# Patient Record
Sex: Male | Born: 1935
Health system: Southern US, Community
[De-identification: ages and names within clinical notes are randomized; demographics above are authoritative.]

## PROBLEM LIST (undated history)

## (undated) DIAGNOSIS — Z8673 Personal history of transient ischemic attack (TIA), and cerebral infarction without residual deficits: Secondary | ICD-10-CM

## (undated) DIAGNOSIS — K219 Gastro-esophageal reflux disease without esophagitis: Secondary | ICD-10-CM

## (undated) DIAGNOSIS — R413 Other amnesia: Secondary | ICD-10-CM

## (undated) DIAGNOSIS — G3184 Mild cognitive impairment, so stated: Secondary | ICD-10-CM

## (undated) DIAGNOSIS — G4730 Sleep apnea, unspecified: Secondary | ICD-10-CM

## (undated) DIAGNOSIS — E785 Hyperlipidemia, unspecified: Secondary | ICD-10-CM

## (undated) DIAGNOSIS — H269 Unspecified cataract: Secondary | ICD-10-CM

## (undated) DIAGNOSIS — N400 Enlarged prostate without lower urinary tract symptoms: Secondary | ICD-10-CM

## (undated) DIAGNOSIS — I10 Essential (primary) hypertension: Secondary | ICD-10-CM

## (undated) DIAGNOSIS — R630 Anorexia: Secondary | ICD-10-CM

## (undated) DIAGNOSIS — M1990 Unspecified osteoarthritis, unspecified site: Secondary | ICD-10-CM

## (undated) DIAGNOSIS — H409 Unspecified glaucoma: Secondary | ICD-10-CM

## (undated) DIAGNOSIS — K5790 Diverticulosis of intestine, part unspecified, without perforation or abscess without bleeding: Secondary | ICD-10-CM

## (undated) HISTORY — DX: Anorexia: R63.0

## (undated) HISTORY — DX: Unspecified glaucoma: H40.9

## (undated) HISTORY — DX: Mild cognitive impairment of uncertain or unknown etiology: G31.84

## (undated) HISTORY — DX: Benign prostatic hyperplasia without lower urinary tract symptoms: N40.0

## (undated) HISTORY — DX: Unspecified cataract: H26.9

## (undated) HISTORY — DX: Diverticulosis of intestine, part unspecified, without perforation or abscess without bleeding: K57.90

## (undated) HISTORY — DX: Other amnesia: R41.3

---

## 1995-10-28 HISTORY — PX: TRANSURETHRAL RESECTION OF PROSTATE: SHX73

## 1999-11-22 ENCOUNTER — Encounter: Payer: Self-pay | Admitting: Pulmonary Disease

## 1999-11-22 ENCOUNTER — Ambulatory Visit (HOSPITAL_COMMUNITY): Admission: RE | Admit: 1999-11-22 | Discharge: 1999-11-22 | Payer: Self-pay | Admitting: Pulmonary Disease

## 1999-11-25 ENCOUNTER — Encounter: Admission: RE | Admit: 1999-11-25 | Discharge: 1999-12-23 | Payer: Self-pay | Admitting: Pulmonary Disease

## 2002-10-04 ENCOUNTER — Ambulatory Visit (HOSPITAL_COMMUNITY): Admission: RE | Admit: 2002-10-04 | Discharge: 2002-10-04 | Payer: Self-pay | Admitting: Gastroenterology

## 2003-12-13 ENCOUNTER — Ambulatory Visit (HOSPITAL_BASED_OUTPATIENT_CLINIC_OR_DEPARTMENT_OTHER): Admission: RE | Admit: 2003-12-13 | Discharge: 2003-12-13 | Payer: Self-pay | Admitting: Pulmonary Disease

## 2004-02-05 ENCOUNTER — Emergency Department (HOSPITAL_COMMUNITY): Admission: EM | Admit: 2004-02-05 | Discharge: 2004-02-05 | Payer: Self-pay | Admitting: Family Medicine

## 2004-02-15 ENCOUNTER — Emergency Department (HOSPITAL_COMMUNITY): Admission: EM | Admit: 2004-02-15 | Discharge: 2004-02-15 | Payer: Self-pay | Admitting: Family Medicine

## 2004-10-15 ENCOUNTER — Ambulatory Visit (HOSPITAL_COMMUNITY): Admission: RE | Admit: 2004-10-15 | Discharge: 2004-10-15 | Payer: Self-pay | Admitting: Pulmonary Disease

## 2004-10-15 ENCOUNTER — Encounter (INDEPENDENT_AMBULATORY_CARE_PROVIDER_SITE_OTHER): Payer: Self-pay | Admitting: Cardiology

## 2005-09-29 ENCOUNTER — Ambulatory Visit (HOSPITAL_BASED_OUTPATIENT_CLINIC_OR_DEPARTMENT_OTHER): Admission: RE | Admit: 2005-09-29 | Discharge: 2005-09-29 | Payer: Self-pay | Admitting: General Surgery

## 2005-09-29 ENCOUNTER — Ambulatory Visit (HOSPITAL_COMMUNITY): Admission: RE | Admit: 2005-09-29 | Discharge: 2005-09-29 | Payer: Self-pay | Admitting: General Surgery

## 2005-09-30 ENCOUNTER — Ambulatory Visit: Payer: Self-pay | Admitting: Oncology

## 2005-11-18 ENCOUNTER — Ambulatory Visit: Payer: Self-pay | Admitting: Oncology

## 2005-11-28 ENCOUNTER — Ambulatory Visit (HOSPITAL_BASED_OUTPATIENT_CLINIC_OR_DEPARTMENT_OTHER): Admission: RE | Admit: 2005-11-28 | Discharge: 2005-11-28 | Payer: Self-pay | Admitting: General Surgery

## 2006-08-20 ENCOUNTER — Ambulatory Visit (HOSPITAL_COMMUNITY): Admission: RE | Admit: 2006-08-20 | Discharge: 2006-08-20 | Payer: Self-pay | Admitting: Pulmonary Disease

## 2006-08-20 ENCOUNTER — Encounter: Payer: Self-pay | Admitting: Vascular Surgery

## 2009-03-11 ENCOUNTER — Emergency Department (HOSPITAL_COMMUNITY): Admission: EM | Admit: 2009-03-11 | Discharge: 2009-03-11 | Payer: Self-pay | Admitting: Family Medicine

## 2009-04-13 ENCOUNTER — Ambulatory Visit (HOSPITAL_COMMUNITY): Admission: RE | Admit: 2009-04-13 | Discharge: 2009-04-13 | Payer: Self-pay | Admitting: Pulmonary Disease

## 2010-01-17 ENCOUNTER — Emergency Department (HOSPITAL_COMMUNITY): Admission: EM | Admit: 2010-01-17 | Discharge: 2010-01-17 | Payer: Self-pay | Admitting: Emergency Medicine

## 2011-03-14 NOTE — Op Note (Signed)
NAME:  Jason Castaneda, Jason Castaneda               ACCOUNT NO.:  [id number]   MEDICAL RECORD NO.:  [id number]          PATIENT TYPE:  AMB   LOCATION:  [facility]                         FACILITY:  [facility]   PHYSICIAN:  Leonie Man, M.D.   DATE OF BIRTH:  1931-05-20   DATE OF PROCEDURE:  11/28/2005  DATE OF DISCHARGE:                                 OPERATIVE REPORT   PREOPERATIVE DIAGNOSIS:  Left inguinal hernia   POSTOPERATIVE DIAGNOSIS:  Left inguinal hernia.   PROCEDURE:  Left inguinal herniorrhaphy with mesh using Prolene hernia mesh  system.   SURGEON:  Leonie Man, M.D.   ASSISTANT:  OR nurse.   ANESTHESIA:  General.   PATHOLOGY:  No specimens to the lab.   ESTIMATED BLOOD LOSS:  Minimal.   COMPLICATIONS:  No complications. The patient went to the PACU in excellent  condition.   Jason Castaneda is a 77 year old gentleman with a left-sided inguinal hernia. His  surgery had been previously postponed due to an elevated partial  thromboplastin time. He has down through a complete workup of his clotting  factors with no abnormalities being seen. Having gotten clearance through  the hematologist, the patient comes to the operating room now for left  inguinal herniorrhaphy. The risks and potential benefits of surgery have  been fully discussed, all questions answered and consent obtained.   DESCRIPTION OF PROCEDURE:  Following the induction of satisfactory general  anesthesia, the patient was positioned supinely and the left groin is  prepped and draped to be included in a sterile operative field. The lower  groin crease is infiltrated with 0.5% Marcaine with epinephrine and a  transverse incision is made into the lower groin crease deep and through the  skin and subcutaneous tissue with dissection carried down to the external  oblique aponeurosis. The external oblique aponeurosis was opened up through  the external inguinal ring with retraction of the ilioinguinal nerve  superiorly and  medially. The spermatic cord is elevated and held with a  Penrose drain. There was no indirect hernia noted. There was a large direct  hernia noted and this was reduced back into the retroperitoneum. The  retroperitoneum was then dissected clear and a Prolene hernia patch mesh  system was inserted into the retroperitoneum and deployed superiorly,  medially, laterally and inferiorly to cover the entire direct space. The the  external portion of the patch was then brought out and deployed into the  inguinal canal. The mesh was then split so as to allow the formation of a  new internal ring and this was packed down to the shelving edge of Poupart's  ligament. The mesh was then trimmed so as to fit into the inguinal canal and  a running suture of 2-0 Novofil was used running from the pubic tubercle up  along the conjoined tendon to the internal ring and again from the pubic  tubercle up along the shelving edge of Poupart's ligament to the internal  ring. Behind the cord, the mesh was deployed over the internal oblique  muscle. All areas of dissection were then meticulously checked for  hemostasis. All  areas of dissection were noted to be dry. Sponge, instrument  and sharp counts were verified. The external oblique aponeurosis was then  closed over the cord with a running 2-0 Vicryl suture. Scarpa's fascia was  closed with a running 3-0 Vicryl suture and the skin was reapproximated with  a running 4-0 Monocryl suture and then reinforced with Steri-Strips. Sterile  dressings were applied. The anesthetic reversed and the patient removed from  the operating room to the recovery room in stable condition. He tolerated  the procedure well.      Leonie Man, M.D.  Electronically Signed     PB/MEDQ  D:  11/28/2005  T:  11/28/2005  Job:  [id number]

## 2011-04-12 ENCOUNTER — Inpatient Hospital Stay (INDEPENDENT_AMBULATORY_CARE_PROVIDER_SITE_OTHER)
Admission: RE | Admit: 2011-04-12 | Discharge: 2011-04-12 | Disposition: A | Discharge status: Home / Self Care | Payer: Medicare Other | Source: Ambulatory Visit | Attending: Family Medicine | Admitting: Family Medicine

## 2011-04-12 DIAGNOSIS — R197 Diarrhea, unspecified: Secondary | ICD-10-CM

## 2011-04-12 DIAGNOSIS — R109 Unspecified abdominal pain: Secondary | ICD-10-CM

## 2011-04-12 LAB — BASIC METABOLIC PANEL
CO2: 25 mEq/L (ref 19–32)
Chloride: 103 mEq/L (ref 96–112)
Creatinine, Ser: 0.72 mg/dL (ref 0.50–1.35)
GFR calc Af Amer: >60 mL/min (ref >60)
Potassium: 3.9 mEq/L (ref 3.5–5.1)
Sodium: 136 mEq/L (ref 135–145)

## 2011-04-12 LAB — CBC
Platelets: 185 10*3/uL (ref 150–400)
RBC: 4.41 MIL/uL (ref 4.22–5.81)
RDW: 12.4 % (ref 11.5–15.5)
WBC: 4 10*3/uL (ref 4.0–10.5)

## 2011-04-12 LAB — DIFFERENTIAL
Basophils Absolute: 0 10*3/uL (ref 0.0–0.1)
Eosinophils Absolute: 0.1 10*3/uL (ref 0.0–0.7)
Eosinophils Relative: 2 % (ref 0–5)
Neutrophils Relative %: 45 % (ref 43–77)

## 2012-07-20 ENCOUNTER — Emergency Department (INDEPENDENT_AMBULATORY_CARE_PROVIDER_SITE_OTHER)
Admission: EM | Admit: 2012-07-20 | Discharge: 2012-07-20 | Disposition: A | Discharge status: Home / Self Care | Payer: Medicare Other | Source: Home / Self Care | Attending: Family Medicine | Admitting: Family Medicine

## 2012-07-20 ENCOUNTER — Encounter (HOSPITAL_COMMUNITY): Payer: Self-pay | Admitting: *Deleted

## 2012-07-20 DIAGNOSIS — M2550 Pain in unspecified joint: Secondary | ICD-10-CM

## 2012-07-20 HISTORY — DX: Essential (primary) hypertension: I10

## 2012-07-20 HISTORY — DX: Gastro-esophageal reflux disease without esophagitis: K21.9

## 2012-07-20 HISTORY — DX: Hyperlipidemia, unspecified: E78.5

## 2012-07-20 MED ORDER — MELOXICAM 7.5 MG PO TABS: 7.5000 mg | ORAL_TABLET | Freq: Every day | ORAL | Status: DC

## 2012-07-20 MED ORDER — ACETAMINOPHEN ER 650 MG PO TBCR: 650.0000 mg | EXTENDED_RELEASE_TABLET | Freq: Three times a day (TID) | ORAL | Status: DC | PRN

## 2012-07-20 NOTE — ED Provider Notes (Signed)
History     CSN: [id number]  Arrival date & time 07/20/12  1510   First MD Initiated Contact with Patient 07/20/12 1615      Chief Complaint  Patient presents with  . Joint Pain  . Toe Pain    (Consider location/radiation/quality/duration/timing/severity/associated sxs/prior treatment) HPI Jason Castaneda is a 78 y.o. male who complains of left shoulder and left side low back pain described as aching, soreness and bothersome mostly following exercise for the past 1 month. Pt is exercising three times a week at [facility]. Pt reports stiffness and joint in the morning. No OTC taken for discomfort. Denies any numbness, tingling to LE.  Complains of L great toe pain x 1 month as well. Denies any injury.  No red flags such as fevers, h/o trauma with bony tenderness, neurological deficits, h/o CA, unexplained weight loss, pain worse at night, pain at rest,  h/o prolonged steroid use or h/o osteopenia.      Past Medical History  Diagnosis Date  . GERD (gastroesophageal reflux disease)   . Hypertension   . Hyperlipemia     History reviewed. No pertinent past surgical history.  Family History  Problem Relation Age of Onset  . Family history unknown: Yes    History  Substance Use Topics  . Smoking status: Never Smoker   . Smokeless tobacco: Not on file  . Alcohol Use: No      Review of Systems  Constitutional: Negative.   Genitourinary: Negative.   Musculoskeletal: Positive for myalgias, back pain and arthralgias. Negative for joint swelling and gait problem.  Skin: Negative.   Neurological: Negative.     Allergies  Review of patient's allergies indicates no known allergies.  Home Medications   Current Outpatient Rx  Name Route Sig Dispense Refill  . ATORVASTATIN CALCIUM 20 MG PO TABS Oral Take 20 mg by mouth daily.    Marland Kitchen OLMESARTAN MEDOXOMIL 20 MG PO TABS Oral Take 20 mg by mouth daily.    Marland Kitchen OMEPRAZOLE 40 MG PO CPDR Oral Take 40 mg by mouth daily.    . ACETAMINOPHEN  ER 650 MG PO TBCR Oral Take 1 tablet (650 mg total) by mouth every 8 (eight) hours as needed for pain. 30 tablet 2  . MELOXICAM 7.5 MG PO TABS Oral Take 1 tablet (7.5 mg total) by mouth daily. 30 tablet 2    BP 146/97  Pulse 62  Temp 98.4 F (36.9 C) (Oral)  Resp 16  SpO2 100%  Physical Exam  Nursing note and vitals reviewed. Constitutional: He is oriented to person, place, and time. Vital signs are normal. He appears well-developed and well-nourished. He is active and cooperative.  HENT:  Head: Normocephalic.  Eyes: Conjunctivae normal are normal. Pupils are equal, round, and reactive to light. No scleral icterus.  Neck: Trachea normal. Neck supple.  Cardiovascular: Normal rate, regular rhythm, normal heart sounds, intact distal pulses and normal pulses.   Pulmonary/Chest: Effort normal and breath sounds normal.  Musculoskeletal: Normal range of motion.       Left shoulder: Normal.       Thoracic back: Normal.       Lumbar back: He exhibits tenderness. He exhibits normal range of motion, no bony tenderness, no swelling, no edema, no deformity, no laceration and no spasm.       Back:       Left upper leg: Normal.       Left lower leg: Normal.       Left foot:  Normal.       Tenderness at palpation to left paraspinal  Neurological: He is alert and oriented to person, place, and time. He has normal strength and normal reflexes. No cranial nerve deficit or sensory deficit. Coordination and gait normal. GCS eye subscore is 4. GCS verbal subscore is 5. GCS motor subscore is 6.  Skin: Skin is warm and dry.  Psychiatric: He has a normal mood and affect. His speech is normal and behavior is normal. Judgment and thought content normal. Cognition and memory are normal.    ED Course  Procedures (including critical care time)  Labs Reviewed - No data to display No results found.   1. Multiple joint pain       MDM  low back pain, has been < 6 week duration. Rest, intermittent  application of cold packs (later, may switch to heat, but do not sleep on heating pad), Tylenol arthritis and Mobic as recommended. Follow up with Dr. Petra Kuba if worsening of Sx. Proper lifting with avoidance of heavy lifting discussed.       Johnsie Kindred, NP 07/20/12 1640

## 2012-07-20 NOTE — ED Notes (Signed)
Pt reports soreness in left shoulder joint, left flank and left great toe - with no known injury. Reports that he has seen pcp for the above with no dx. Denies any injury, problem urinating or decreased rom.

## 2012-07-23 NOTE — ED Provider Notes (Signed)
Medical screening examination/treatment/procedure(s) were performed by resident physician or non-physician practitioner and as supervising physician I was immediately available for consultation/collaboration.   Barkley Bruns MD.    Linna Hoff, MD 07/23/12 [phone]

## 2013-02-24 HISTORY — PX: CATARACT EXTRACTION: SUR2

## 2013-07-07 ENCOUNTER — Emergency Department (HOSPITAL_COMMUNITY)
Admission: EM | Admit: 2013-07-07 | Discharge: 2013-07-07 | Disposition: A | Discharge status: Home / Self Care | Payer: Medicare Other | Source: Home / Self Care | Attending: Family Medicine | Admitting: Family Medicine

## 2013-07-07 ENCOUNTER — Emergency Department (INDEPENDENT_AMBULATORY_CARE_PROVIDER_SITE_OTHER): Payer: Medicare Other

## 2013-07-07 ENCOUNTER — Encounter (HOSPITAL_COMMUNITY): Payer: Self-pay | Admitting: Emergency Medicine

## 2013-07-07 DIAGNOSIS — M5430 Sciatica, unspecified side: Secondary | ICD-10-CM

## 2013-07-07 DIAGNOSIS — M5431 Sciatica, right side: Secondary | ICD-10-CM

## 2013-07-07 MED ORDER — KETOROLAC TROMETHAMINE 30 MG/ML IJ SOLN
30.0000 mg | Freq: Once | INTRAMUSCULAR | Status: AC
  Administered 2013-07-07: 30 mg via INTRAMUSCULAR

## 2013-07-07 MED ORDER — METHYLPREDNISOLONE 4 MG PO KIT: PACK | ORAL | Status: DC

## 2013-07-07 MED ORDER — KETOROLAC TROMETHAMINE 30 MG/ML IJ SOLN
INTRAMUSCULAR | Status: AC
  Filled 2013-07-07: qty 1

## 2013-07-07 NOTE — ED Notes (Signed)
Pt c/o joint pain onset 10 days Sxs include: bilateral shoulder pain and pain on right side hip Pain increases w/activity... He is alert w/no signs of acute distress.

## 2013-07-07 NOTE — ED Provider Notes (Addendum)
CSN: [id number]     Arrival date & time 07/07/13  1519 History   First MD Initiated Contact with Patient 07/07/13 1551     Chief Complaint  Patient presents with  . Joint Pain   (Consider location/radiation/quality/duration/timing/severity/associated sxs/prior Treatment) Patient is a 79 y.o. male presenting with back pain. The history is provided by the patient.  Back Pain Location:  Sacro-iliac joint and gluteal region Quality:  Shooting Radiates to:  R posterior upper leg Pain severity:  Mild Onset quality:  Gradual Duration:  10 days Progression:  Unchanged Chronicity:  New Context comment:  Gradual onset, NKI. Relieved by:  None tried Worsened by:  Bending and movement Ineffective treatments:  None tried Associated symptoms: paresthesias   Associated symptoms: no abdominal pain, no bladder incontinence, no bowel incontinence, no chest pain, no dysuria, no fever, no numbness and no weakness   Risk factors comment:  Oa of shoulders, hips.   Past Medical History  Diagnosis Date  . GERD (gastroesophageal reflux disease)   . Hypertension   . Hyperlipemia    History reviewed. No pertinent past surgical history. No family history on file. History  Substance Use Topics  . Smoking status: Never Smoker   . Smokeless tobacco: Not on file  . Alcohol Use: No    Review of Systems  Constitutional: Negative for fever.  Cardiovascular: Negative for chest pain.  Gastrointestinal: Negative for nausea, vomiting, abdominal pain and bowel incontinence.  Genitourinary: Negative for bladder incontinence, dysuria, scrotal swelling and testicular pain.  Musculoskeletal: Positive for back pain. Negative for joint swelling and gait problem.  Skin: Negative.   Neurological: Positive for paresthesias. Negative for weakness and numbness.    Allergies  Review of patient's allergies indicates no known allergies.  Home Medications   Current Outpatient Rx  Name  Route  Sig  Dispense   Refill  . acetaminophen (TYLENOL ARTHRITIS PAIN) 650 MG CR tablet   Oral   Take 1 tablet (650 mg total) by mouth every 8 (eight) hours as needed for pain.   30 tablet   2   . atorvastatin (LIPITOR) 20 MG tablet   Oral   Take 20 mg by mouth daily.         . meloxicam (MOBIC) 7.5 MG tablet   Oral   Take 1 tablet (7.5 mg total) by mouth daily.   30 tablet   2   . methylPREDNISolone (MEDROL DOSEPAK) 4 MG tablet      follow package directions, start on fri, continue until finished.   21 tablet   0   . olmesartan (BENICAR) 20 MG tablet   Oral   Take 20 mg by mouth daily.         Marland Kitchen omeprazole (PRILOSEC) 40 MG capsule   Oral   Take 40 mg by mouth daily.          BP 123/56  Pulse 77  Temp(Src) 97.8 F (36.6 C) (Oral)  Resp 16  SpO2 99% Physical Exam  Nursing note and vitals reviewed. Constitutional: He is oriented to person, place, and time.  Musculoskeletal: He exhibits tenderness.       Lumbar back: He exhibits tenderness, pain and spasm. He exhibits normal range of motion, no edema, no deformity and normal pulse.       Back:  Neurological: He is alert and oriented to person, place, and time.  Skin: Skin is warm and dry.    ED Course  Procedures (including critical care time) Labs Review  Labs Reviewed - No data to display Imaging Review Dg Lumbar Spine Complete  07/07/2013   CLINICAL DATA:  Low back pain. No known injury.  EXAM: LUMBAR SPINE - COMPLETE 4+ VIEW  COMPARISON:  None.  FINDINGS: Mild degenerative spurring in the mid and lower lumbar spine. Disc spaces are maintained. Normal alignment. No fracture. SI joints are symmetric and unremarkable.  IMPRESSION: Mild degenerative spurring. No acute findings.   Electronically Signed   By: Charlett Nose M.D.   On: 07/07/2013 16:33    MDM  X-rays reviewed and report per radiologist.    Linna Hoff, MD 07/07/13 1643  Linna Hoff, MD 07/07/13 1644  Linna Hoff, MD 07/07/13 2057

## 2015-03-23 ENCOUNTER — Inpatient Hospital Stay (HOSPITAL_COMMUNITY)
Admission: EM | Admit: 2015-03-23 | Discharge: 2015-03-25 | DRG: 378 | Disposition: A | Discharge status: Home / Self Care | Payer: Medicare Other | Attending: Internal Medicine | Admitting: Internal Medicine

## 2015-03-23 ENCOUNTER — Other Ambulatory Visit: Payer: Self-pay | Admitting: Pulmonary Disease

## 2015-03-23 ENCOUNTER — Encounter (HOSPITAL_COMMUNITY): Payer: Self-pay | Admitting: *Deleted

## 2015-03-23 ENCOUNTER — Ambulatory Visit
Admission: RE | Admit: 2015-03-23 | Discharge: 2015-03-23 | Disposition: A | Discharge status: Home / Self Care | Payer: Medicare Other | Source: Ambulatory Visit | Attending: Pulmonary Disease | Admitting: Pulmonary Disease

## 2015-03-23 DIAGNOSIS — K219 Gastro-esophageal reflux disease without esophagitis: Secondary | ICD-10-CM

## 2015-03-23 DIAGNOSIS — K5791 Diverticulosis of intestine, part unspecified, without perforation or abscess with bleeding: Secondary | ICD-10-CM | POA: Diagnosis not present

## 2015-03-23 DIAGNOSIS — E785 Hyperlipidemia, unspecified: Secondary | ICD-10-CM | POA: Diagnosis present

## 2015-03-23 DIAGNOSIS — K5731 Diverticulosis of large intestine without perforation or abscess with bleeding: Principal | ICD-10-CM

## 2015-03-23 DIAGNOSIS — Z7982 Long term (current) use of aspirin: Secondary | ICD-10-CM | POA: Diagnosis not present

## 2015-03-23 DIAGNOSIS — I10 Essential (primary) hypertension: Secondary | ICD-10-CM | POA: Diagnosis present

## 2015-03-23 DIAGNOSIS — K922 Gastrointestinal hemorrhage, unspecified: Secondary | ICD-10-CM | POA: Diagnosis present

## 2015-03-23 DIAGNOSIS — M79604 Pain in right leg: Secondary | ICD-10-CM

## 2015-03-23 DIAGNOSIS — K5790 Diverticulosis of intestine, part unspecified, without perforation or abscess without bleeding: Secondary | ICD-10-CM | POA: Diagnosis present

## 2015-03-23 DIAGNOSIS — D62 Acute posthemorrhagic anemia: Secondary | ICD-10-CM | POA: Diagnosis present

## 2015-03-23 DIAGNOSIS — K625 Hemorrhage of anus and rectum: Secondary | ICD-10-CM | POA: Diagnosis present

## 2015-03-23 DIAGNOSIS — M545 Low back pain: Secondary | ICD-10-CM

## 2015-03-23 HISTORY — DX: Sleep apnea, unspecified: G47.30

## 2015-03-23 HISTORY — DX: Unspecified osteoarthritis, unspecified site: M19.90

## 2015-03-23 LAB — COMPREHENSIVE METABOLIC PANEL
ALBUMIN: 3.5 g/dL (ref 3.5–5.0)
ALT: 14 U/L — ABNORMAL LOW (ref 17–63)
ANION GAP: 7 (ref 5–15)
AST: 17 U/L (ref 15–41)
Alkaline Phosphatase: 57 U/L (ref 38–126)
BILIRUBIN TOTAL: 0.6 mg/dL (ref 0.3–1.2)
BUN: 15 mg/dL (ref 6–20)
CALCIUM: 10 mg/dL (ref 8.9–10.3)
CO2: 28 mmol/L (ref 22–32)
Chloride: 107 mmol/L (ref 101–111)
Creatinine, Ser: 0.99 mg/dL (ref 0.61–1.24)
GLUCOSE: 102 mg/dL — AB (ref 65–99)
POTASSIUM: 4.4 mmol/L (ref 3.5–5.1)
SODIUM: 142 mmol/L (ref 135–145)
TOTAL PROTEIN: 6.1 g/dL — AB (ref 6.5–8.1)

## 2015-03-23 LAB — ABO/RH: ABO/RH(D): O POS

## 2015-03-23 LAB — CBC
HCT: 34.1 % — ABNORMAL LOW (ref 39.0–52.0)
HEMOGLOBIN: 11.9 g/dL — AB (ref 13.0–17.0)
MCH: 33.3 pg (ref 26.0–34.0)
MCHC: 34.9 g/dL (ref 30.0–36.0)
MCV: 95.5 fL (ref 78.0–100.0)
Platelets: 211 10*3/uL (ref 150–400)
RBC: 3.57 MIL/uL — AB (ref 4.22–5.81)
RDW: 13.3 % (ref 11.5–15.5)
WBC: 5.1 10*3/uL (ref 4.0–10.5)

## 2015-03-23 LAB — POC OCCULT BLOOD, ED: Fecal Occult Bld: POSITIVE — AB

## 2015-03-23 LAB — TYPE AND SCREEN
ABO/RH(D): O POS
Antibody Screen: NEGATIVE

## 2015-03-23 LAB — TSH: TSH: 1.296 u[IU]/mL (ref 0.350–4.500)

## 2015-03-23 LAB — PROTIME-INR
INR: 1.13 (ref 0.00–1.49)
Prothrombin Time: 14.7 seconds (ref 11.6–15.2)

## 2015-03-23 MED ORDER — MORPHINE SULFATE 2 MG/ML IJ SOLN: 1.0000 mg | INTRAMUSCULAR | Status: DC | PRN

## 2015-03-23 MED ORDER — HYDROCODONE-ACETAMINOPHEN 5-325 MG PO TABS: 1.0000 | ORAL_TABLET | ORAL | Status: DC | PRN

## 2015-03-23 MED ORDER — SODIUM CHLORIDE 0.9 % IV SOLN: INTRAVENOUS | Status: DC

## 2015-03-23 MED ORDER — ONDANSETRON HCL 4 MG/2ML IJ SOLN: 4.0000 mg | Freq: Four times a day (QID) | INTRAMUSCULAR | Status: DC | PRN

## 2015-03-23 MED ORDER — ACETAMINOPHEN 325 MG PO TABS: 650.0000 mg | ORAL_TABLET | Freq: Four times a day (QID) | ORAL | Status: DC | PRN

## 2015-03-23 MED ORDER — PANTOPRAZOLE SODIUM 40 MG IV SOLR
40.0000 mg | Freq: Every day | INTRAVENOUS | Status: DC
  Administered 2015-03-23 – 2015-03-24 (×2): 40 mg via INTRAVENOUS
  Filled 2015-03-23 (×3): qty 40

## 2015-03-23 MED ORDER — SODIUM CHLORIDE 0.9 % IV SOLN
INTRAVENOUS | Status: DC
  Administered 2015-03-23 – 2015-03-24 (×3): via INTRAVENOUS

## 2015-03-23 MED ORDER — ONDANSETRON HCL 4 MG PO TABS: 4.0000 mg | ORAL_TABLET | Freq: Four times a day (QID) | ORAL | Status: DC | PRN

## 2015-03-23 MED ORDER — ACETAMINOPHEN 650 MG RE SUPP: 650.0000 mg | Freq: Four times a day (QID) | RECTAL | Status: DC | PRN

## 2015-03-23 NOTE — ED Provider Notes (Signed)
CSN: [id number]     Arrival date & time 03/23/15  1446 History   First MD Initiated Contact with Patient 03/23/15 1634     Chief Complaint  Patient presents with  . Rectal Bleeding     (Consider location/radiation/quality/duration/timing/severity/associated sxs/prior Treatment) Patient is a 81 y.o. male presenting with hematochezia. The history is provided by the patient.  Rectal Bleeding Associated symptoms: no abdominal pain, no fever and no vomiting   Patient w hx diverticula, c/o 4-5 episodes dark/maroon blood per rectum in the past day. Moderate amounts. Several episodes. Started yesterday. No rectal pain. No trauma. No hx gi bleeding. Hx diverticula on colonoscopy approximately 5 yrs ago. No abd pain. No nv. Normal appetite. No faintness or dizziness. No recent constipation or straining. No hx hemorrhoids. No anticoag use. No other abn bleeding or bruising.       Past Medical History  Diagnosis Date  . GERD (gastroesophageal reflux disease)   . Hypertension   . Hyperlipemia    History reviewed. No pertinent past surgical history. No family history on file. History  Substance Use Topics  . Smoking status: Never Smoker   . Smokeless tobacco: Not on file  . Alcohol Use: No    Review of Systems  Constitutional: Negative for fever and chills.  HENT: Negative for sore throat.   Eyes: Negative for redness.  Respiratory: Negative for cough and shortness of breath.   Cardiovascular: Negative for chest pain.  Gastrointestinal: Positive for hematochezia and anal bleeding. Negative for vomiting, abdominal pain and diarrhea.  Genitourinary: Negative for dysuria and flank pain.  Musculoskeletal: Negative for back pain and neck pain.  Skin: Negative for rash.  Neurological: Negative for syncope.  Hematological: Does not bruise/bleed easily.  Psychiatric/Behavioral: Negative for confusion.      Allergies  Review of patient's allergies indicates no known allergies.  Home  Medications   Prior to Admission medications   Medication Sig Start Date End Date Taking? Authorizing Provider  acetaminophen (TYLENOL ARTHRITIS PAIN) 650 MG CR tablet Take 1 tablet (650 mg total) by mouth every 8 (eight) hours as needed for pain. 07/20/12   Johnsie Kindred, NP  atorvastatin (LIPITOR) 20 MG tablet Take 20 mg by mouth daily.    Historical Provider, MD  meloxicam (MOBIC) 7.5 MG tablet Take 1 tablet (7.5 mg total) by mouth daily. 07/20/12   Johnsie Kindred, NP  methylPREDNISolone (MEDROL DOSEPAK) 4 MG tablet follow package directions, start on fri, continue until finished. 07/07/13   Linna Hoff, MD  olmesartan (BENICAR) 20 MG tablet Take 20 mg by mouth daily.    Historical Provider, MD  omeprazole (PRILOSEC) 40 MG capsule Take 40 mg by mouth daily.    Historical Provider, MD   BP 170/90 mmHg  Pulse 61  Temp(Src) 98.5 F (36.9 C) (Oral)  Resp 17  Ht  (1.676 m)  Wt 157 lb (71.215 kg)  BMI 25.35 kg/m2  SpO2 100% Physical Exam  Constitutional: He is oriented to person, place, and time. He appears well-developed and well-nourished. No distress.  HENT:  Mouth/Throat: Oropharynx is clear and moist.  Eyes: Conjunctivae are normal. No scleral icterus.  Neck: Neck supple. No tracheal deviation present.  Cardiovascular: Normal rate, regular rhythm, normal heart sounds and intact distal pulses.   Pulmonary/Chest: Effort normal and breath sounds normal. No accessory muscle usage. No respiratory distress.  Abdominal: Soft. Bowel sounds are normal. He exhibits no distension and no mass. There is no tenderness. There is no  rebound and no guarding.  Genitourinary:  Dark blood on exam. No fissure. No mass felt. No ext hem.   Musculoskeletal: Normal range of motion. He exhibits no edema or tenderness.  Neurological: He is alert and oriented to person, place, and time.  Skin: Skin is warm and dry. No rash noted. He is not diaphoretic.  Psychiatric: He has a normal mood and affect.   Nursing note and vitals reviewed.   ED Course  Procedures (including critical care time) Labs Review   Results for orders placed or performed during the hospital encounter of 03/23/15  CBC  Result Value Ref Range   WBC 5.1 4.0 - 10.5 K/uL   RBC 3.57 (L) 4.22 - 5.81 MIL/uL   Hemoglobin 11.9 (L) 13.0 - 17.0 g/dL   HCT 40.934.1 (L) 81.139.0 - 91.452.0 %   MCV 95.5 78.0 - 100.0 fL   MCH 33.3 26.0 - 34.0 pg   MCHC 34.9 30.0 - 36.0 g/dL   RDW 78.213.3 95.611.5 - 21.315.5 %   Platelets 211 150 - 400 K/uL  Comprehensive metabolic panel  Result Value Ref Range   Sodium 142 135 - 145 mmol/L   Potassium 4.4 3.5 - 5.1 mmol/L   Chloride 107 101 - 111 mmol/L   CO2 28 22 - 32 mmol/L   Glucose, Bld 102 (H) 65 - 99 mg/dL   BUN 15 6 - 20 mg/dL   Creatinine, Ser 0.860.99 0.61 - 1.24 mg/dL   Calcium 57.810.0 8.9 - 46.910.3 mg/dL   Total Protein 6.1 (L) 6.5 - 8.1 g/dL   Albumin 3.5 3.5 - 5.0 g/dL   AST 17 15 - 41 U/L   ALT 14 (L) 17 - 63 U/L   Alkaline Phosphatase 57 38 - 126 U/L   Total Bilirubin 0.6 0.3 - 1.2 mg/dL   GFR calc non Af Amer >60 >60 mL/min   GFR calc Af Amer >60 >60 mL/min   Anion gap 7 5 - 15  POC occult blood, ED Provider will collect  Result Value Ref Range   Fecal Occult Bld POSITIVE (A) NEGATIVE  Type and screen  Result Value Ref Range   ABO/RH(D) O POS    Antibody Screen NEG    Sample Expiration 03/26/2015   ABO/Rh  Result Value Ref Range   ABO/RH(D) O POS       MDM   Iv ns bolus.  Labs.  Reviewed nursing notes and prior charts for additional history.   Recheck pt, no change from prior.  Dr Bosie ClosSchooler is pts gi doctor - discussed w on call gi for him, Dr Matthias HughsBuccini - he indicates they will see/consult on pt in AM, admit to hospitalist.  Hospitalist consulted for admission.     Cathren LaineKevin Kalven Ganim, MD 03/23/15 [phone]

## 2015-03-23 NOTE — H&P (Signed)
[facility] History and Physical  Jason MarvelRobert Dufford UJW:[id number] DOB: 1931/08/16 DOA: 03/23/2015  Referring physician:EDP PCP: Eino FarberKILPATRICK JR,GEORGE R, MD   Chief Complaint: Rectal bleeding  HPI: Jason MarvelRobert Castaneda is a 81 y.o. male hospital history of hypertension and diverticulosis seen in colonoscopy about 5 years ago. Patient came into the hospital complaining about rectal bleeding. Bleeding started yesterday, total of 5 bloody bowel movements since then. Blood is maroon colored, mixed with stools, painless and not associated with nausea or vomiting. No recent weight loss, no use of NSAIDs per him. In the ED vitals are stable, no tachycardia, hemoglobin 11.9, no recent hemoglobin but hemoglobin was 15 about 3 years ago. Patient admitted to the hospital for observation.   Review of Systems:  Constitutional: negative for anorexia, fevers and sweats Eyes: negative for irritation, redness and visual disturbance Ears, nose, mouth, throat, and face: negative for earaches, epistaxis, nasal congestion and sore throat Respiratory: negative for cough, dyspnea on exertion, sputum and wheezing Cardiovascular: negative for chest pain, dyspnea, lower extremity edema, orthopnea, palpitations and syncope Gastrointestinal: Rectal bleed per history of present illness Genitourinary:negative for dysuria, frequency and hematuria Hematologic/lymphatic: negative for bleeding, easy bruising and lymphadenopathy Musculoskeletal:negative for arthralgias, muscle weakness and stiff joints Neurological: negative for coordination problems, gait problems, headaches and weakness Endocrine: negative for diabetic symptoms including polydipsia, polyuria and weight loss Allergic/Immunologic: negative for anaphylaxis, hay fever and urticaria  Past Medical History  Diagnosis Date  . GERD (gastroesophageal reflux disease)   . Hypertension   . Hyperlipemia    History reviewed. No pertinent past surgical history. Social  History:   reports that he has never smoked. He does not have any smokeless tobacco history on file. He reports that he does not drink alcohol or use illicit drugs.  No Known Allergies  Family history:  No family history of colon cancer  Prior to Admission medications   Medication Sig Start Date End Date Taking? Authorizing Provider  atorvastatin (LIPITOR) 20 MG tablet Take 20 mg by mouth daily.   Yes Historical Provider, MD  olmesartan (BENICAR) 20 MG tablet Take 20 mg by mouth daily.   Yes Historical Provider, MD  omeprazole (PRILOSEC) 40 MG capsule Take 40 mg by mouth daily.   Yes Historical Provider, MD  acetaminophen (TYLENOL ARTHRITIS PAIN) 650 MG CR tablet Take 1 tablet (650 mg total) by mouth every 8 (eight) hours as needed for pain. Patient not taking: Reported on 03/23/2015 07/20/12   Johnsie Kindredarmen L Chatten, NP  meloxicam (MOBIC) 7.5 MG tablet Take 1 tablet (7.5 mg total) by mouth daily. Patient not taking: Reported on 03/23/2015 07/20/12   Johnsie Kindredarmen L Chatten, NP  methylPREDNISolone (MEDROL DOSEPAK) 4 MG tablet follow package directions, start on fri, continue until finished. Patient not taking: Reported on 03/23/2015 07/07/13   Linna HoffJames D Kindl, MD   Physical Exam: Filed Vitals:   03/23/15 1845  BP: 137/64  Pulse: 66  Temp:   Resp: 13   Constitutional: Oriented to person, place, and time. Well-developed and well-nourished. Cooperative.  Head: Normocephalic and atraumatic.  Nose: Nose normal.  Mouth/Throat: Uvula is midline, oropharynx is clear and moist and mucous membranes are normal.  Eyes: Conjunctivae and EOM are normal. Pupils are equal, round, and reactive to light.  Neck: Trachea normal and normal range of motion. Neck supple.  Cardiovascular: Normal rate, regular rhythm, S1 normal, S2 normal, normal heart sounds and intact distal pulses.   Pulmonary/Chest: Effort normal and breath sounds normal.  Abdominal: Soft. Bowel sounds are  normal. There is no hepatosplenomegaly. There is  no tenderness.  Musculoskeletal: Normal range of motion.  Neurological: Alert and oriented to person, place, and time. Has normal strength. No cranial nerve deficit or sensory deficit.  Skin: Skin is warm, dry and intact.  Psychiatric: Has a normal mood and affect. Speech is normal and behavior is normal.   Labs on Admission:  Basic Metabolic Panel:  Recent Labs Lab 03/23/15 1505  NA 142  K 4.4  CL 107  CO2 28  GLUCOSE 102*  BUN 15  CREATININE 0.99  CALCIUM 10.0   Liver Function Tests:  Recent Labs Lab 03/23/15 1505  AST 17  ALT 14*  ALKPHOS 57  BILITOT 0.6  PROT 6.1*  ALBUMIN 3.5   No results for input(s): LIPASE, AMYLASE in the last 168 hours. No results for input(s): AMMONIA in the last 168 hours. CBC:  Recent Labs Lab 03/23/15 1505  WBC 5.1  HGB 11.9*  HCT 34.1*  MCV 95.5  PLT 211   Cardiac Enzymes: No results for input(s): CKTOTAL, CKMB, CKMBINDEX, TROPONINI in the last 168 hours.  BNP (last 3 results) No results for input(s): BNP in the last 8760 hours.  ProBNP (last 3 results) No results for input(s): PROBNP in the last 8760 hours.  CBG: No results for input(s): GLUCAP in the last 168 hours.  Radiological Exams on Admission: Dg Lumbar Spine Complete  03/23/2015   CLINICAL DATA:  Low back pain radiating down bilateral legs. Symptoms for 1 year. No known injury.  EXAM: LUMBAR SPINE - COMPLETE 4+ VIEW  COMPARISON:  Night 08/2013  FINDINGS: Mild degenerative spurring anteriorly at L2-3. Disc spaces are maintained. Normal alignment. No fracture. SI joints are symmetric and unremarkable.  IMPRESSION: No acute bony abnormality.   Electronically Signed   By: Charlett Nose M.D.   On: 03/23/2015 11:38    EKG: No EKG  Assessment/Plan Principal Problem:   GI bleeding Active Problems:   Diverticulosis   Hypertension   GERD (gastroesophageal reflux disease)   Acute blood loss anemia    GI bleed Likely lower GI bleed, maroon painless rectal bleeding  started 1 day PTA. Likely a diverticular bleed, less likely to be AVMs or even malignancy. Patient is taking aspirin, will hold. Clear liquid diet Observe, likely this is self-limiting, per him start to slow down anyway, only one bloody bowel movement today. [facility] notified by EDP to evaluate him in the morning.  Acute blood loss anemia No recent baseline, but likely acute blood loss anemia secondary to GI bleed. Hemoglobin baseline from 2012 is 15, hemoglobin today is 11.9. Check hemoglobin twice a day, transfuse if hemoglobin 8.0 or consistent downwards trend.  Hypertension Hold pressure medications on settings of GI bleed.  GERD Denies any recent symptoms related to his GERD, patient takes PPI at home. Restarted Protonix.   Code Status: Full code Family Communication: Plan discussed with the patient Disposition Plan: MedSurg, inpatient  Time spent: 70 minutes  [facility] A, MD [facility] Pager [phone]

## 2015-03-23 NOTE — ED Notes (Signed)
Pt states that he has been having rectal bleeding with loose stools (5) since yesterday morning. Denies any other symptoms. Pt was sent here by PCP for eval. Pt has hx of diverticulosis.

## 2015-03-23 NOTE — Progress Notes (Signed)
Called and received report from ED RN, Duwayne Heckanielle.  [facility] [employer] MinorKimberly Emilianna Barlowe RN-BC, WTA.

## 2015-03-24 LAB — CBC
HCT: 31 % — ABNORMAL LOW (ref 39.0–52.0)
HCT: 29.7 % — ABNORMAL LOW (ref 39.0–52.0)
Hemoglobin: 10.7 g/dL — ABNORMAL LOW (ref 13.0–17.0)
Hemoglobin: 10.4 g/dL — ABNORMAL LOW (ref 13.0–17.0)
MCH: 33 pg (ref 26.0–34.0)
MCH: 33.3 pg (ref 26.0–34.0)
MCHC: 34.5 g/dL (ref 30.0–36.0)
MCHC: 35 g/dL (ref 30.0–36.0)
MCV: 95.7 fL (ref 78.0–100.0)
MCV: 95.2 fL (ref 78.0–100.0)
PLATELETS: 189 10*3/uL (ref 150–400)
PLATELETS: 193 10*3/uL (ref 150–400)
RBC: 3.24 MIL/uL — ABNORMAL LOW (ref 4.22–5.81)
RBC: 3.12 MIL/uL — ABNORMAL LOW (ref 4.22–5.81)
RDW: 13.2 % (ref 11.5–15.5)
RDW: 12.9 % (ref 11.5–15.5)
WBC: 4.1 10*3/uL (ref 4.0–10.5)
WBC: 4.8 10*3/uL (ref 4.0–10.5)

## 2015-03-24 LAB — BASIC METABOLIC PANEL
Anion gap: 7 (ref 5–15)
BUN: 14 mg/dL (ref 6–20)
CO2: 24 mmol/L (ref 22–32)
Calcium: 9.2 mg/dL (ref 8.9–10.3)
Chloride: 107 mmol/L (ref 101–111)
Creatinine, Ser: 0.9 mg/dL (ref 0.61–1.24)
GFR calc Af Amer: >60 mL/min (ref >60)
GFR calc non Af Amer: >60 mL/min (ref >60)
Glucose, Bld: 104 mg/dL — ABNORMAL HIGH (ref 65–99)
POTASSIUM: 4.1 mmol/L (ref 3.5–5.1)
SODIUM: 138 mmol/L (ref 135–145)

## 2015-03-24 NOTE — Progress Notes (Signed)
[facility] PROGRESS NOTE   Jason Castaneda NGE:[id number] DOB: Oct 31, 1930 DOA: 03/23/2015 PCP: Eino Farber, MD  HPI/Subjective: No bowel movement so far this morning. Denies any nausea vomiting or abdominal pain.  Assessment/Plan: Principal Problem:   GI bleeding Active Problems:   Diverticulosis   Hypertension   GERD (gastroesophageal reflux disease)   Acute blood loss anemia   GI bleed Likely lower GI bleed, maroon painless rectal bleeding started 1 day PTA. Likely a diverticular bleed, less likely to be AVMs or even malignancy. Patient is taking aspirin, will hold. Clear liquid diet Had 4 bloody BMs on Thursday, 3 bloody BMs on Friday with color lighting up. Currently no bowel movement so far. Appears to be slowing down.  Acute blood loss anemia No recent baseline, but likely acute blood loss anemia secondary to GI bleed. Hemoglobin baseline from 2012 is 15, hemoglobin today is 11.9. Check hemoglobin twice a day, transfuse if hemoglobin 8.0 or consistent downwards trend. Hemoglobin today is 10.7.  Hypertension Hold pressure medications on settings of GI bleed.  GERD Denies any recent symptoms related to his GERD, patient takes PPI at home. Restarted Protonix.  Code Status: Full Code Family Communication: Plan discussed with the patient. Disposition Plan: Remains inpatient Diet: Diet clear liquid Room service appropriate?: Yes; Fluid consistency:: Thin  Consultants:  GI  Procedures:  None  Antibiotics:  None   Objective: Filed Vitals:   03/24/15 0935  BP: 127/70  Pulse: 65  Temp: 98.6 F (37 C)  Resp: 16    Intake/Output Summary (Last 24 hours) at 03/24/15 1235 Last data filed at 03/24/15 0900  Gross per 24 hour  Intake 1083.75 ml  Output      0 ml  Net 1083.75 ml   Filed Weights   03/23/15 1456 03/23/15 2016  Weight: 71.215 kg (157 lb) 68.04 kg (150 lb)    Exam: General: Alert and awake, oriented x3, not in any  acute distress. HEENT: anicteric sclera, pupils reactive to light and accommodation, EOMI CVS: S1-S2 clear, no murmur rubs or gallops Chest: clear to auscultation bilaterally, no wheezing, rales or rhonchi Abdomen: soft nontender, nondistended, normal bowel sounds, no organomegaly Extremities: no cyanosis, clubbing or edema noted bilaterally Neuro: Cranial nerves II-XII intact, no focal neurological deficits  Data Reviewed: Basic Metabolic Panel:  Recent Labs Lab 03/23/15 1505 03/24/15 0528  NA 142 138  K 4.4 4.1  CL 107 107  CO2 28 24  GLUCOSE 102* 104*  BUN 15 14  CREATININE 0.99 0.90  CALCIUM 10.0 9.2   Liver Function Tests:  Recent Labs Lab 03/23/15 1505  AST 17  ALT 14*  ALKPHOS 57  BILITOT 0.6  PROT 6.1*  ALBUMIN 3.5   No results for input(s): LIPASE, AMYLASE in the last 168 hours. No results for input(s): AMMONIA in the last 168 hours. CBC:  Recent Labs Lab 03/23/15 1505 03/24/15 0528  WBC 5.1 4.1  HGB 11.9* 10.7*  HCT 34.1* 31.0*  MCV 95.5 95.7  PLT 211 189   Cardiac Enzymes: No results for input(s): CKTOTAL, CKMB, CKMBINDEX, TROPONINI in the last 168 hours. BNP (last 3 results) No results for input(s): BNP in the last 8760 hours.  ProBNP (last 3 results) No results for input(s): PROBNP in the last 8760 hours.  CBG: No results for input(s): GLUCAP in the last 168 hours.  Micro No results found for this or any previous visit (from the past 240 hour(s)).   Studies: Dg Lumbar Spine Complete  03/23/2015  CLINICAL DATA:  Low back pain radiating down bilateral legs. Symptoms for 1 year. No known injury.  EXAM: LUMBAR SPINE - COMPLETE 4+ VIEW  COMPARISON:  Night 08/2013  FINDINGS: Mild degenerative spurring anteriorly at L2-3. Disc spaces are maintained. Normal alignment. No fracture. SI joints are symmetric and unremarkable.  IMPRESSION: No acute bony abnormality.   Electronically Signed   By: Charlett NoseKevin  Dover M.D.   On: 03/23/2015 11:38     Scheduled Meds: . pantoprazole (PROTONIX) IV  40 mg Intravenous QHS   Continuous Infusions: . sodium chloride 75 mL/hr at 03/23/15 2157       Time spent: 35 minutes    [facility] LLCELMAHI,[name]  [facility] Pager [phone] If 7PM-7AM, please contact night-coverage at [email or web address], password [email or web address] 03/24/2015, 12:35 PM  LOS: 1 day

## 2015-03-24 NOTE — Progress Notes (Signed)
Record reviewed.   Will be consulting on patient later today, as requested.  Call me in the meantime if needed.  Florencia Reasonsobert V. Aubreyanna Dorrough, M.D. Pager [phone] If no answer or after 5 PM call [phone]

## 2015-03-24 NOTE — Consult Note (Signed)
Referring Provider: Dr. Clydia Llano Primary Care Physician:  Eino Farber, MD Primary Gastroenterologist:  Dr. Doy Mince  Reason for Consultation:  Lower GI bleeding  HPI: Jason Castaneda is a 81 y.o. male with a known history of sigmoid diverticulosis by colonoscopy 5 years ago, who had some minor self-limited rectal bleeding several months ago, attributed to hemorrhoids when he saw his primary gastroenterologist. He then did well until 2 days ago when he was awakened around 5 in the morning by the need to have a bowel movement, which was bloody. He had a total of 4 bloody bowel movements that day, but without any abdominal pain or orthostatic symptomatology. He had one additional bowel movement yesterday and one so far today, where the stool looks brown but there was still some blood in the water. He has never had previous GI bleeding. He is on a daily aspirin but BUN on admission was 15 and has remained in the normal range since then.   Past Medical History  Diagnosis Date  . GERD (gastroesophageal reflux disease)   . Hypertension   . Hyperlipemia   . Sleep apnea   . Arthritis     hp bone,knees,hands,shoulders    History reviewed. No pertinent past surgical history.  Prior to Admission medications   Medication Sig Start Date End Date Taking? Authorizing Provider  atorvastatin (LIPITOR) 20 MG tablet Take 20 mg by mouth daily.   Yes Historical Provider, MD  olmesartan (BENICAR) 20 MG tablet Take 20 mg by mouth daily.   Yes Historical Provider, MD  omeprazole (PRILOSEC) 40 MG capsule Take 40 mg by mouth daily.   Yes Historical Provider, MD  acetaminophen (TYLENOL ARTHRITIS PAIN) 650 MG CR tablet Take 1 tablet (650 mg total) by mouth every 8 (eight) hours as needed for pain. Patient not taking: Reported on 03/23/2015 07/20/12   Johnsie Kindred, NP  meloxicam (MOBIC) 7.5 MG tablet Take 1 tablet (7.5 mg total) by mouth daily. Patient not taking: Reported on 03/23/2015 07/20/12    Johnsie Kindred, NP  methylPREDNISolone (MEDROL DOSEPAK) 4 MG tablet follow package directions, start on fri, continue until finished. Patient not taking: Reported on 03/23/2015 07/07/13   Linna Hoff, MD    Current Facility-Administered Medications  Medication Dose Route Frequency Provider Last Rate Last Dose  . 0.9 %  sodium chloride infusion   Intravenous Continuous Clydia Llano, MD 75 mL/hr at 03/23/15 2157    . acetaminophen (TYLENOL) tablet 650 mg  650 mg Oral Q6H PRN Clydia Llano, MD       Or  . acetaminophen (TYLENOL) suppository 650 mg  650 mg Rectal Q6H PRN Clydia Llano, MD      . HYDROcodone-acetaminophen (NORCO/VICODIN) 5-325 MG per tablet 1-2 tablet  1-2 tablet Oral Q4H PRN Clydia Llano, MD      . morphine 2 MG/ML injection 1 mg  1 mg Intravenous Q4H PRN Clydia Llano, MD      . ondansetron (ZOFRAN) tablet 4 mg  4 mg Oral Q6H PRN Clydia Llano, MD       Or  . ondansetron (ZOFRAN) injection 4 mg  4 mg Intravenous Q6H PRN Clydia Llano, MD      . pantoprazole (PROTONIX) injection 40 mg  40 mg Intravenous QHS Clydia Llano, MD   40 mg at 03/23/15 2157    Allergies as of 03/23/2015  . (No Known Allergies)    History reviewed. No pertinent family history.  History   Social History  .  Marital Status: Married    Spouse Name: N/A  . Number of Children: N/A  . Years of Education: N/A   Occupational History  . Not on file.   Social History Main Topics  . Smoking status: Never Smoker   . Smokeless tobacco: Not on file  . Alcohol Use: No  . Drug Use: No  . Sexual Activity: No   Other Topics Concern  . Not on file   Social History Narrative    Review of Systems:  Generally negative. No problem with headache, dizziness, chest pain, shortness of breath, chronic cough, joint effusions or swelling, skin problems, or urinary difficulties. 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 Physical  Exam: Vital signs in last 24 hours: Temp:  [98.5 F (36.9 C)-98.6 F (37 C)] 98.6 F (37 C) (05/28 0935) Pulse Rate:  [62-68] 65 (05/28 0935) Resp:  [13-18] 16 (05/28 0935) BP: (116-170)/(57-90) 127/70 mmHg (05/28 0935) SpO2:  [97 %-100 %] 100 % (05/28 0935) Weight:  [68.04 kg (150 lb)] 68.04 kg (150 lb) (05/27 2016) Last BM Date: 03/23/15 General:   Alert,  Well-developed, well-nourished, pleasant and cooperative lying in bed in NAD Head:  Normocephalic and atraumatic. Eyes:  Sclera clear, no icterus.    Lungs:  Clear throughout to auscultation.   No wheezes, crackles, or rhonchi. No evident respiratory distress. Heart:   Regular rate and rhythm; no murmurs (perhaps a soft systolic murmur present), clicks, rubs,  or gallops. Abdomen:  Soft, nontender, nontympanitic, and nondistended. No masses, hepatosplenomegaly or ventral hernias noted. Normal bowel sounds, without bruits, guarding, or rebound.   Msk:   Symmetrical without gross deformities. Pulses:  Normal radial pulse is noted. Extremities:   Without clubbing, cyanosis Neurologic:  Alert and coherent;  grossly normal neurologically. Skin:  Intact without significant lesions or rashes, warm and dry. Psych:   Alert and cooperative. Normal mood and affect.  Intake/Output from previous day: 05/27 0701 - 05/28 0700 In: 723.8 [P.O.:120; I.V.:603.8] Out: 0  Intake/Output this shift: Total I/O In: 360 [P.O.:360] Out: -   Lab Results:  Recent Labs  03/23/15 1505 03/24/15 0528  WBC 5.1 4.1  HGB 11.9* 10.7*  HCT 34.1* 31.0*  PLT 211 189   BMET  Recent Labs  03/23/15 1505 03/24/15 0528  NA 142 138  K 4.4 4.1  CL 107 107  CO2 28 24  GLUCOSE 102* 104*  BUN 15 14  CREATININE 0.99 0.90  CALCIUM 10.0 9.2   LFT  Recent Labs  03/23/15 1505  PROT 6.1*  ALBUMIN 3.5  AST 17  ALT 14*  ALKPHOS 57  BILITOT 0.6   PT/INR  Recent Labs  03/23/15 2150  LABPROT 14.7  INR 1.13    Studies/Results: Dg Lumbar Spine  Complete  03/23/2015   CLINICAL DATA:  Low back pain radiating down bilateral legs. Symptoms for 1 year. No known injury.  EXAM: LUMBAR SPINE - COMPLETE 4+ VIEW  COMPARISON:  Night 08/2013  FINDINGS: Mild degenerative spurring anteriorly at L2-3. Disc spaces are maintained. Normal alignment. No fracture. SI joints are symmetric and unremarkable.  IMPRESSION: No acute bony abnormality.   Electronically Signed   By: Charlett NoseKevin  Dover M.D.   On: 03/23/2015 11:38    Impression: 1. Hematochezia, almost certainly of lower tract origin given the patient's clinical stability and relatively modest decline in hemoglobin. Given his known history of diverticulosis in the distal colon, and the  absence of abdominal pain or frank diarrhea, I think that diverticular hemorrhage rather than dysenteric colitis or ischemic colitis is the most likely explanation for the patient's clinical presentation. Clinically, it appears that the bleeding has either slowed way down or has stopped entirely, given only 2 bowel movements over the past 24 hours.  2. Posthemorrhagic anemia, mild. The patient is not anywhere near needing a transfusion.  Plan: Continue observation and supportive care.  Agree with clear liquid diet today, probably okay to advance to solid food tomorrow.  Keeping in mind that it is not uncommon for diverticular bleeding to recur after being quiescent for a day or 2 I think that discharge tomorrow is possible, or certainly by Monday, if the patient remains stable.  As long as the patient is without further active bleeding, I do not feel that colonoscopy on this admission is necessary, but it would be appropriate to do it in the next month or so as an outpatient.  If the patient does have acute recurrent bleeding during this hospitalization, I would consider obtaining a bleeding scan with consideration of interventional angiography if active bleeding is confirmed on the bleeding scan.     LOS: 1 day    Elenna Spratling,Cur[place] V  03/24/2015, 3:03 PM   Pager [phone] If no answer or after 5 PM call [phone]

## 2015-03-25 LAB — CBC
HCT: 30.6 % — ABNORMAL LOW (ref 39.0–52.0)
Hemoglobin: 10.8 g/dL — ABNORMAL LOW (ref 13.0–17.0)
MCH: 33.1 pg (ref 26.0–34.0)
MCHC: 35.3 g/dL (ref 30.0–36.0)
MCV: 93.9 fL (ref 78.0–100.0)
PLATELETS: 190 10*3/uL (ref 150–400)
RBC: 3.26 MIL/uL — ABNORMAL LOW (ref 4.22–5.81)
RDW: 12.8 % (ref 11.5–15.5)
WBC: 3.9 10*3/uL — ABNORMAL LOW (ref 4.0–10.5)

## 2015-03-25 NOTE — Progress Notes (Signed)
Hemoglobin stable. One bowel movement in the past 24 hours, yesterday evening, which still had some blood with it. Up and around walking, without orthostatic symptoms.   Impression: Quiescent lower GI bleed, most likely of diverticular origin. I think his minimal blood per rectum yesterday evening was probably delayed passage of old blood.  Recommendation:  1. Okay for discharge from my standpoint. We will sign off, call if questions. 2. Have recommended that the patient follow up with his primary gastroenterologist,  Dr. Charlott RakesVincent Schooler, sometime in the next couple of weeks. The patient was due for an updated colonoscopy at this time anyway. 3. I have advised the patient about the possibility of recurrent bleeding, and how to differentiate that from delayed passage of old blood.  Florencia Reasonsobert V. Arzella Rehmann, M.D. Pager [phone] If no answer or after 5 PM call [phone]

## 2015-03-25 NOTE — Discharge Summary (Signed)
Physician Discharge Summary  Jason Castaneda ZOX:[id number] DOB: 1931/07/26 DOA: 03/23/2015  PCP: Eino Farber, MD  Admit date: 03/23/2015 Discharge date: 03/25/2015  Time spent: 40 minutes  Recommendations for Outpatient Follow-up:  1. Follow-up with primary care physician within one week. 2. Follow-up with primary gastroenterologist, per patient he is due for 5 years colonoscopy.  Discharge Diagnoses:  Principal Problem:   GI bleeding Active Problems:   Diverticulosis   Hypertension   GERD (gastroesophageal reflux disease)   Acute blood loss anemia   Discharge Condition: Stable  Diet recommendation: Heart healthy  Filed Weights   03/23/15 1456 03/23/15 2016  Weight: 71.215 kg (157 lb) 68.04 kg (150 lb)    History of present illness:  Jason Castaneda is a 81 y.o. male hospital history of hypertension and diverticulosis seen in colonoscopy about 5 years ago. Patient came into the hospital complaining about rectal bleeding. Bleeding started yesterday, total of 5 bloody bowel movements since then. Blood is maroon colored, mixed with stools, painless and not associated with nausea or vomiting. No recent weight loss, no use of NSAIDs per him. In the ED vitals are stable, no tachycardia, hemoglobin 11.9, no recent hemoglobin but hemoglobin was 15 about 3 years ago. Patient admitted to the hospital for observation.  Hospital Course:   GI bleed Likely lower GI bleed, maroon painless rectal bleeding started 1 day PTA. Likely a diverticular bleed, less likely to be AVMs or even malignancy. Patient is taking aspirin, will hold. Clear liquid diet Had 4 bloody BMs on Thursday, 3 bloody BMs on Friday with color lighting up. Bleeding stopped, patient had very minor blood on last bowel movement, overall much better than on admission. Patient follow-up with primary care physician. And GI in 1 month for further care  Acute blood loss anemia No recent baseline, but likely acute blood  loss anemia secondary to GI bleed. Hemoglobin baseline from 2012 is 15, hemoglobin today is 11.9. Check hemoglobin twice a day, transfuse if hemoglobin 8.0 or consistent downwards trend. Hemoglobin today is 10.7.  Hypertension Hold pressure medications on settings of GI bleed.  GERD Denies any recent symptoms related to his GERD, patient takes PPI at home. Restarted Protonix.   Procedures:  None  Consultations:  GI  Discharge Exam: Filed Vitals:   03/25/15 0908  BP: 119/59  Pulse: 69  Temp: 98.1 F (36.7 C)  Resp: 17   General: Alert and awake, oriented x3, not in any acute distress. HEENT: anicteric sclera, pupils reactive to light and accommodation, EOMI CVS: S1-S2 clear, no murmur rubs or gallops Chest: clear to auscultation bilaterally, no wheezing, rales or rhonchi Abdomen: soft nontender, nondistended, normal bowel sounds, no organomegaly Extremities: no cyanosis, clubbing or edema noted bilaterally Neuro: Cranial nerves II-XII intact, no focal neurological deficits  Discharge Instructions   Discharge Instructions    Diet - low sodium heart healthy    Complete by:  As directed      Increase activity slowly    Complete by:  As directed           Current Discharge Medication List    CONTINUE these medications which have NOT CHANGED   Details  atorvastatin (LIPITOR) 20 MG tablet Take 20 mg by mouth daily.    olmesartan (BENICAR) 20 MG tablet Take 20 mg by mouth daily.    omeprazole (PRILOSEC) 40 MG capsule Take 40 mg by mouth daily.    acetaminophen (TYLENOL ARTHRITIS PAIN) 650 MG CR tablet Take 1 tablet (650 mg  total) by mouth every 8 (eight) hours as needed for pain. Qty: 30 tablet, Refills: 2      STOP taking these medications     meloxicam (MOBIC) 7.5 MG tablet      methylPREDNISolone (MEDROL DOSEPAK) 4 MG tablet        No Known Allergies    The results of significant diagnostics from this hospitalization (including imaging,  microbiology, ancillary and laboratory) are listed below for reference.    Significant Diagnostic Studies: Dg Lumbar Spine Complete  03/23/2015   CLINICAL DATA:  Low back pain radiating down bilateral legs. Symptoms for 1 year. No known injury.  EXAM: LUMBAR SPINE - COMPLETE 4+ VIEW  COMPARISON:  Night 08/2013  FINDINGS: Mild degenerative spurring anteriorly at L2-3. Disc spaces are maintained. Normal alignment. No fracture. SI joints are symmetric and unremarkable.  IMPRESSION: No acute bony abnormality.   Electronically Signed   By: Charlett NoseKevin  Dover M.D.   On: 03/23/2015 11:38    Microbiology: No results found for this or any previous visit (from the past 240 hour(s)).   Labs: Basic Metabolic Panel:  Recent Labs Lab 03/23/15 1505 03/24/15 0528  NA 142 138  K 4.4 4.1  CL 107 107  CO2 28 24  GLUCOSE 102* 104*  BUN 15 14  CREATININE 0.99 0.90  CALCIUM 10.0 9.2   Liver Function Tests:  Recent Labs Lab 03/23/15 1505  AST 17  ALT 14*  ALKPHOS 57  BILITOT 0.6  PROT 6.1*  ALBUMIN 3.5   No results for input(s): LIPASE, AMYLASE in the last 168 hours. No results for input(s): AMMONIA in the last 168 hours. CBC:  Recent Labs Lab 03/23/15 1505 03/24/15 0528 03/24/15 1700 03/25/15 0700  WBC 5.1 4.1 4.8 3.9*  HGB 11.9* 10.7* 10.4* 10.8*  HCT 34.1* 31.0* 29.7* 30.6*  MCV 95.5 95.7 95.2 93.9  PLT 211 189 193 190   Cardiac Enzymes: No results for input(s): CKTOTAL, CKMB, CKMBINDEX, TROPONINI in the last 168 hours. BNP: BNP (last 3 results) No results for input(s): BNP in the last 8760 hours.  ProBNP (last 3 results) No results for input(s): PROBNP in the last 8760 hours.  CBG: No results for input(s): GLUCAP in the last 168 hours.     Signed:  Anella Nakata A  [facility] 03/25/2015, 2:01 PM

## 2015-03-27 LAB — HEMOGLOBIN A1C
Hgb A1c MFr Bld: 5.8 % — ABNORMAL HIGH (ref 4.8–5.6)
MEAN PLASMA GLUCOSE: 120 mg/dL

## 2015-05-17 LAB — HM COLONOSCOPY

## 2016-04-25 ENCOUNTER — Encounter: Payer: Self-pay | Admitting: Podiatry

## 2016-04-25 ENCOUNTER — Ambulatory Visit (INDEPENDENT_AMBULATORY_CARE_PROVIDER_SITE_OTHER): Payer: Medicare Other | Admitting: Podiatry

## 2016-04-25 VITALS — BP 174/87 | HR 63 | Ht 66.0 in | Wt 160.0 lb

## 2016-04-25 DIAGNOSIS — M79673 Pain in unspecified foot: Secondary | ICD-10-CM | POA: Diagnosis not present

## 2016-04-25 DIAGNOSIS — M779 Enthesopathy, unspecified: Secondary | ICD-10-CM | POA: Diagnosis not present

## 2016-04-25 DIAGNOSIS — M7752 Other enthesopathy of left foot: Secondary | ICD-10-CM

## 2016-04-25 DIAGNOSIS — M205X2 Other deformities of toe(s) (acquired), left foot: Secondary | ICD-10-CM

## 2016-04-25 DIAGNOSIS — M205X9 Other deformities of toe(s) (acquired), unspecified foot: Secondary | ICD-10-CM | POA: Diagnosis not present

## 2016-04-25 DIAGNOSIS — M79606 Pain in leg, unspecified: Secondary | ICD-10-CM

## 2016-04-25 NOTE — Progress Notes (Signed)
SUBJECTIVE: 82 y.o. year old male presents with painful feet. Patient is referred by Dr. Petra KubaKilpatrick.  Corn on 2nd toe right foot is sore at the end of the day, another painful area under the first MPJ left foot x 6 months.  Does exercise 3 times a week and hurts afterwards.  Soaking makes feel better.   REVIEW OF SYSTEMS: A comprehensive review of systems was negative except for: Hypertension.  OBJECTIVE: DERMATOLOGIC EXAMINATION: Painful corn at medial aspect of the distal interphalangeal joint area 2nd digit right.  VASCULAR EXAMINATION OF LOWER LIMBS: Pedal pulses: All pedal pulses are palpable with normal pulsation.  Capillary Filling times within 3 seconds in all digits.  No edema or erythema noted.  Temperature gradient from tibial crest to dorsum of foot is within normal bilateral.  NEUROLOGIC EXAMINATION OF THE LOWER LIMBS: All epicritic and tactile sensations grossly intact.   MUSCULOSKELETAL EXAMINATION: Plantar flexed DIPJ with medial bone spur right foot. Positive for dorsal bunion left foot. Palpable dorsal bone spur at the first metatarsal head left.  Limited joint motion first MPJ left.  RADIOGRAPHIC STUDIES:  General overview: AP View:  Increased sclerotic changes with loss of joint space at the first MPJ left. Positive of excess bone formation at articular margins medial and lateral. No significant deformities noted on right foot.  Lateral view:  Positive of dorsal spur first Metatarsal head left.  Joint space is minimum, with narrow line.   ASSESSMENT: 1. Hallux limitus left first MPJ due to excess bone formation. 2. Excess spurring at bunion site left foot. 3. Pain in lower limb with ambulation. 4. Painful corn 2nd digit medial aspect of DIPJ right foot.  PLAN: Painful corn debrided. Reviewed clinical, radiographic findings and available treatment options. Reviewed surgical option of left great toe joint to clean out excess bone spur and possible  complication. Patient will contact us when he is ready to have excess bone spurs cleaned out form the first MPJ left foot.

## 2016-04-25 NOTE — Patient Instructions (Signed)
Seen for painful feet. Noted of severe arthritic joint left bunion site with spur formation. Right foot painful corn debrided.  Left foot surgical consent form reviewed.

## 2016-05-15 ENCOUNTER — Other Ambulatory Visit: Payer: Self-pay | Admitting: Podiatry

## 2016-05-15 MED ORDER — HYDROCODONE-IBUPROFEN 7.5-200 MG PO TABS: 1.0000 | ORAL_TABLET | Freq: Four times a day (QID) | ORAL | Status: DC | PRN

## 2016-05-16 DIAGNOSIS — M2010 Hallux valgus (acquired), unspecified foot: Secondary | ICD-10-CM | POA: Diagnosis not present

## 2016-05-16 HISTORY — PX: BUNIONECTOMY: SHX129

## 2016-05-22 ENCOUNTER — Encounter: Payer: Self-pay | Admitting: Podiatry

## 2016-05-22 ENCOUNTER — Ambulatory Visit (INDEPENDENT_AMBULATORY_CARE_PROVIDER_SITE_OTHER): Payer: Medicare Other | Admitting: Podiatry

## 2016-05-22 DIAGNOSIS — M205X2 Other deformities of toe(s) (acquired), left foot: Secondary | ICD-10-CM

## 2016-05-22 NOTE — Patient Instructions (Signed)
Post op wound doing well. Keep the dressing intact and dry.  Return in 1 week.

## 2016-05-22 NOTE — Progress Notes (Signed)
1 week post op following McBride bunionectomy left foot. Patient denies any discomfort. Wound is clean and wet at incision site from rubbed off steri strips. Wound cleansed with Iodine. Steri strips replaced. Betadine wet to dry dressing applied. Return in one week.

## 2016-05-29 ENCOUNTER — Ambulatory Visit (INDEPENDENT_AMBULATORY_CARE_PROVIDER_SITE_OTHER): Payer: Medicare Other | Admitting: Podiatry

## 2016-05-29 ENCOUNTER — Encounter: Payer: Self-pay | Admitting: Podiatry

## 2016-05-29 VITALS — BP 125/71 | HR 61

## 2016-05-29 DIAGNOSIS — Z9889 Other specified postprocedural states: Secondary | ICD-10-CM

## 2016-05-29 NOTE — Progress Notes (Signed)
2 week post op following McBride bunionectomy left foot (05/16/16).Patient denies any discomfort. Wound is clean and well coapted in skin edges. Suture removed. Tube foam gauze placed. Return in 2 weeks.

## 2016-05-29 NOTE — Patient Instructions (Signed)
2 Weeks normal post op wound. Suture removed. Ok to get the foot wet. Keep it dry. Use tube foam during the day. May wear any comfortable shoes. Return in 2 weeks.

## 2016-06-11 ENCOUNTER — Encounter: Payer: Self-pay | Admitting: Podiatry

## 2016-06-11 ENCOUNTER — Ambulatory Visit (INDEPENDENT_AMBULATORY_CARE_PROVIDER_SITE_OTHER): Payer: Medicare Other | Admitting: Podiatry

## 2016-06-11 DIAGNOSIS — Z9889 Other specified postprocedural states: Secondary | ICD-10-CM

## 2016-06-11 NOTE — Patient Instructions (Signed)
Satisfactory post op recovery. May return to regular shoes as tolerated.

## 2016-06-11 NOTE — Progress Notes (Signed)
4 week post op following McBride bunionectomy left foot (05/16/16).Patient denies any discomfort. Wound has healed well with minimum scar. Positive of mild forefoot edema. Satisfactory post op recovery. Ok to return to regular shoes as tolerated. Return as needed.

## 2017-01-29 ENCOUNTER — Other Ambulatory Visit (HOSPITAL_COMMUNITY): Payer: Self-pay | Admitting: Pulmonary Disease

## 2017-01-29 DIAGNOSIS — R2681 Unsteadiness on feet: Secondary | ICD-10-CM

## 2017-02-04 ENCOUNTER — Ambulatory Visit (HOSPITAL_COMMUNITY)
Admission: RE | Admit: 2017-02-04 | Discharge: 2017-02-04 | Disposition: A | Discharge status: Home / Self Care | Payer: Medicare Other | Source: Ambulatory Visit | Attending: Pulmonary Disease | Admitting: Pulmonary Disease

## 2017-02-04 DIAGNOSIS — R2681 Unsteadiness on feet: Secondary | ICD-10-CM | POA: Diagnosis present

## 2017-09-21 ENCOUNTER — Other Ambulatory Visit: Payer: Self-pay | Admitting: Pharmacist

## 2017-09-21 NOTE — Patient Outreach (Signed)
Incoming call from [employer] in response to the EMMI Medication Adherence Campaign. Speak with patient. HIPAA identifiers verified and verbal consent received.  [name] reports that he takes his atorvastatin and losartan each once daily as directed. Patient reports that he seldom misses a dose of either medication, maybe once every two weeks. Reports that he most likely misses a dose if he goes out of town. Counsel patient on the importance of medication adherence and strategies for helping him to remember to take his medications, such as the use of a pillbox and how to use this when going out of town. Patient verbalizes understanding.   Patient reports that he uses [facility] mail order. [name] reports concern about the cost of his generic Nexium. Review the formulary for the patient's plan from the [employer] website. Note that omeprazole and pantoprazole are lower tier alternatives for the patient's Nexium/esomeprazole. Offer to contact patient's PCP to ask him to consider one of these alternatives for cost savings to the patient. [name] asks that I contact his PCP.  [name] denies any further medication questions/concerns at this time. Provide patient with my phone number.  PLAN  Will call to follow up with patient's PCP, Dr. Petra KubaKilpatrick to ask that he consider changing the patient from esomeprazole (Nexium) to pantoprazole or omeprazole for cost savings to the patient.  Jason Castaneda, PharmD, [facility] LLCBCACP Clinical Pharmacist [facility] [phone]

## 2017-09-21 NOTE — Patient Outreach (Signed)
Leave a message with patient's PCP, Dr. Petra KubaKilpatrick, to let him know about the cost savings to his patient of changing Mr. Kretsch from Nexium/esomeprazole to pantoprazole or omeprazole, if appropriate for this patient.   Duanne MoronElisabeth Errin Whitelaw, PharmD, [facility] Rd[facility]ACP Clinical Pharmacist [facility] [phone]

## 2018-11-18 ENCOUNTER — Other Ambulatory Visit: Payer: Self-pay | Admitting: Pulmonary Disease

## 2018-11-18 ENCOUNTER — Other Ambulatory Visit (HOSPITAL_COMMUNITY): Payer: Self-pay | Admitting: Pulmonary Disease

## 2018-11-18 DIAGNOSIS — R29898 Other symptoms and signs involving the musculoskeletal system: Secondary | ICD-10-CM

## 2018-11-18 DIAGNOSIS — M545 Low back pain, unspecified: Secondary | ICD-10-CM

## 2018-11-26 ENCOUNTER — Ambulatory Visit (HOSPITAL_COMMUNITY)
Admission: RE | Admit: 2018-11-26 | Discharge: 2018-11-26 | Disposition: A | Discharge status: Home / Self Care | Payer: Medicare Other | Source: Ambulatory Visit | Attending: Pulmonary Disease | Admitting: Pulmonary Disease

## 2018-11-26 DIAGNOSIS — R29898 Other symptoms and signs involving the musculoskeletal system: Secondary | ICD-10-CM

## 2018-11-26 DIAGNOSIS — M545 Low back pain, unspecified: Secondary | ICD-10-CM

## 2019-04-15 ENCOUNTER — Other Ambulatory Visit: Payer: Self-pay | Admitting: Pulmonary Disease

## 2019-04-15 ENCOUNTER — Ambulatory Visit
Admission: RE | Admit: 2019-04-15 | Discharge: 2019-04-15 | Disposition: A | Discharge status: Home / Self Care | Payer: Medicare Other | Source: Ambulatory Visit | Attending: Pulmonary Disease | Admitting: Pulmonary Disease

## 2019-04-15 DIAGNOSIS — M25551 Pain in right hip: Secondary | ICD-10-CM

## 2019-04-15 DIAGNOSIS — M79604 Pain in right leg: Secondary | ICD-10-CM

## 2021-01-25 DIAGNOSIS — M4726 Other spondylosis with radiculopathy, lumbar region: Secondary | ICD-10-CM | POA: Diagnosis not present

## 2021-01-30 DIAGNOSIS — M5416 Radiculopathy, lumbar region: Secondary | ICD-10-CM | POA: Diagnosis not present

## 2021-01-30 DIAGNOSIS — M4726 Other spondylosis with radiculopathy, lumbar region: Secondary | ICD-10-CM | POA: Diagnosis not present

## 2021-01-30 DIAGNOSIS — M6281 Muscle weakness (generalized): Secondary | ICD-10-CM | POA: Diagnosis not present

## 2021-02-19 DIAGNOSIS — R634 Abnormal weight loss: Secondary | ICD-10-CM | POA: Diagnosis not present

## 2021-02-19 DIAGNOSIS — K3189 Other diseases of stomach and duodenum: Secondary | ICD-10-CM | POA: Diagnosis not present

## 2021-02-19 DIAGNOSIS — N200 Calculus of kidney: Secondary | ICD-10-CM | POA: Diagnosis not present

## 2021-02-19 DIAGNOSIS — R310 Gross hematuria: Secondary | ICD-10-CM | POA: Diagnosis not present

## 2021-03-19 DIAGNOSIS — R3121 Asymptomatic microscopic hematuria: Secondary | ICD-10-CM | POA: Diagnosis not present

## 2021-03-19 DIAGNOSIS — R310 Gross hematuria: Secondary | ICD-10-CM | POA: Diagnosis not present

## 2021-04-18 DIAGNOSIS — N401 Enlarged prostate with lower urinary tract symptoms: Secondary | ICD-10-CM | POA: Diagnosis not present

## 2021-04-18 DIAGNOSIS — M159 Polyosteoarthritis, unspecified: Secondary | ICD-10-CM | POA: Diagnosis not present

## 2021-04-18 DIAGNOSIS — E7800 Pure hypercholesterolemia, unspecified: Secondary | ICD-10-CM | POA: Diagnosis not present

## 2021-04-18 DIAGNOSIS — M792 Neuralgia and neuritis, unspecified: Secondary | ICD-10-CM | POA: Diagnosis not present

## 2021-04-18 DIAGNOSIS — H9190 Unspecified hearing loss, unspecified ear: Secondary | ICD-10-CM | POA: Diagnosis not present

## 2021-04-18 DIAGNOSIS — I119 Hypertensive heart disease without heart failure: Secondary | ICD-10-CM | POA: Diagnosis not present

## 2021-04-18 DIAGNOSIS — Z79899 Other long term (current) drug therapy: Secondary | ICD-10-CM | POA: Diagnosis not present

## 2021-04-18 DIAGNOSIS — M791 Myalgia, unspecified site: Secondary | ICD-10-CM | POA: Diagnosis not present

## 2021-04-18 DIAGNOSIS — R350 Frequency of micturition: Secondary | ICD-10-CM | POA: Diagnosis not present

## 2021-04-18 DIAGNOSIS — G3184 Mild cognitive impairment, so stated: Secondary | ICD-10-CM | POA: Diagnosis not present

## 2021-04-18 DIAGNOSIS — R319 Hematuria, unspecified: Secondary | ICD-10-CM | POA: Diagnosis not present

## 2021-05-28 ENCOUNTER — Other Ambulatory Visit: Payer: Self-pay

## 2021-05-28 ENCOUNTER — Ambulatory Visit (INDEPENDENT_AMBULATORY_CARE_PROVIDER_SITE_OTHER): Payer: PPO | Admitting: Otolaryngology

## 2021-05-28 DIAGNOSIS — H6123 Impacted cerumen, bilateral: Secondary | ICD-10-CM | POA: Diagnosis not present

## 2021-05-28 NOTE — Progress Notes (Signed)
HPI: Jason STASH Sr. is a 87 y.o. male who presents is referred by his PCP Dr. Petra Kuba for evaluation of cerumen impaction in both ear canals with decreased hearing..  Past Medical History:  Diagnosis Date   Arthritis    hp bone,knees,hands,shoulders   GERD (gastroesophageal reflux disease)    Hyperlipemia    Hypertension    Sleep apnea    Past Surgical History:  Procedure Laterality Date   BUNIONECTOMY Left 05/16/2016   Social History   Socioeconomic History   Marital status: Widowed    Spouse name: Not on file   Number of children: Not on file   Years of education: Not on file   Highest education level: Not on file  Occupational History   Not on file  Tobacco Use   Smoking status: Never   Smokeless tobacco: Not on file  Substance and Sexual Activity   Alcohol use: No   Drug use: No   Sexual activity: Never  Other Topics Concern   Not on file  Social History Narrative   Not on file   Social Determinants of Health   Financial Resource Strain: Not on file  Food Insecurity: Not on file  Transportation Needs: Not on file  Physical Activity: Not on file  Stress: Not on file  Social Connections: Not on file   No family history on file. No Known Allergies Prior to Admission medications   Medication Sig Start Date End Date Taking? Authorizing Provider  acetaminophen (TYLENOL ARTHRITIS PAIN) 650 MG CR tablet Take 1 tablet (650 mg total) by mouth every 8 (eight) hours as needed for pain. 07/20/12   Chatten, Katherine Basset, NP  atorvastatin (LIPITOR) 10 MG tablet Take 10 mg by mouth daily.    [provider]  esomeprazole (NEXIUM) 40 MG capsule Take 40 mg by mouth daily at 12 noon.    [provider]  HYDROcodone-ibuprofen (VICOPROFEN) 7.5-200 MG tablet Take 1 tablet by mouth every 6 (six) hours as needed for moderate pain. 05/15/16   Sheard, Myeong O, DPM  losartan (COZAAR) 50 MG tablet Take 50 mg by mouth daily.    [provider]      Positive ROS: Otherwise negative  All other systems have been reviewed and were otherwise negative with the exception of those mentioned in the HPI and as above.  Physical Exam: Constitutional: Alert, well-appearing, no acute distress Ears: External ears without lesions or tenderness.  He has small ear canals bilaterally with excessive hair buildup.  Ear canals were cleaned with forceps and suction along with some hydroperoxide.  After cleaning the ear canals the TMs themselves were clear bilaterally. Nasal: External nose without lesions. . Clear nasal passages Oral: Lips and gums without lesions. Tongue and palate mucosa without lesions. Posterior oropharynx clear. Neck: No palpable adenopathy or masses Respiratory: Breathing comfortably  Skin: No facial/neck lesions or rash noted.  Cerumen impaction removal  Date/Time: 05/28/2021 5:17 PM Performed by: Drema Halon, MD Authorized by: Drema Halon, MD   Consent:    Consent obtained:  Verbal   Consent given by:  Patient   Risks discussed:  Pain and bleeding Procedure details:    Location:  L ear and R ear   Procedure type: suction and forceps   Post-procedure details:    Inspection:  TM intact and canal normal   Hearing quality:  Improved   Procedure completion:  Tolerated well, no immediate complications Comments:     Patient with small ear canals  bilaterally.  Ear canals were cleaned with hydroperoxide and suction.  TMs were clear bilaterally.  Assessment: Bilateral cerumen impactions  Plan: This was cleaned in the office.  TMs were clear bilaterally. He will follow-up as needed.   Narda Bonds, MD   CC:

## 2021-06-06 DIAGNOSIS — H903 Sensorineural hearing loss, bilateral: Secondary | ICD-10-CM | POA: Diagnosis not present

## 2021-08-22 DIAGNOSIS — R350 Frequency of micturition: Secondary | ICD-10-CM | POA: Diagnosis not present

## 2021-08-22 DIAGNOSIS — Z79899 Other long term (current) drug therapy: Secondary | ICD-10-CM | POA: Diagnosis not present

## 2021-08-22 DIAGNOSIS — N400 Enlarged prostate without lower urinary tract symptoms: Secondary | ICD-10-CM | POA: Diagnosis not present

## 2021-08-22 DIAGNOSIS — I119 Hypertensive heart disease without heart failure: Secondary | ICD-10-CM | POA: Diagnosis not present

## 2021-08-22 DIAGNOSIS — M159 Polyosteoarthritis, unspecified: Secondary | ICD-10-CM | POA: Diagnosis not present

## 2021-08-22 DIAGNOSIS — M2550 Pain in unspecified joint: Secondary | ICD-10-CM | POA: Diagnosis not present

## 2021-08-22 DIAGNOSIS — G3184 Mild cognitive impairment, so stated: Secondary | ICD-10-CM | POA: Diagnosis not present

## 2021-08-22 DIAGNOSIS — M609 Myositis, unspecified: Secondary | ICD-10-CM | POA: Diagnosis not present

## 2021-08-22 DIAGNOSIS — E7800 Pure hypercholesterolemia, unspecified: Secondary | ICD-10-CM | POA: Diagnosis not present

## 2021-08-22 DIAGNOSIS — E559 Vitamin D deficiency, unspecified: Secondary | ICD-10-CM | POA: Diagnosis not present

## 2021-08-22 DIAGNOSIS — R311 Benign essential microscopic hematuria: Secondary | ICD-10-CM | POA: Diagnosis not present

## 2021-10-25 ENCOUNTER — Ambulatory Visit
Admission: EM | Admit: 2021-10-25 | Discharge: 2021-10-25 | Disposition: A | Discharge status: Home / Self Care | Payer: PPO | Attending: Student | Admitting: Student

## 2021-10-25 ENCOUNTER — Other Ambulatory Visit: Payer: Self-pay

## 2021-10-25 ENCOUNTER — Ambulatory Visit (INDEPENDENT_AMBULATORY_CARE_PROVIDER_SITE_OTHER): Payer: PPO

## 2021-10-25 DIAGNOSIS — Z87891 Personal history of nicotine dependence: Secondary | ICD-10-CM

## 2021-10-25 DIAGNOSIS — Z711 Person with feared health complaint in whom no diagnosis is made: Secondary | ICD-10-CM

## 2021-10-25 DIAGNOSIS — R059 Cough, unspecified: Secondary | ICD-10-CM | POA: Diagnosis not present

## 2021-10-25 LAB — POCT URINALYSIS DIP (MANUAL ENTRY)
Blood, UA: NEGATIVE
Glucose, UA: NEGATIVE mg/dL
Leukocytes, UA: NEGATIVE
Nitrite, UA: NEGATIVE
Protein Ur, POC: 100 mg/dL — AB
Spec Grav, UA: >=1.03 — AB (ref 1.010–1.025)
Urobilinogen, UA: 0.2 E.U./dL
pH, UA: 5.5 (ref 5.0–8.0)

## 2021-10-25 MED ORDER — PREDNISONE 20 MG PO TABS: 40.0000 mg | ORAL_TABLET | Freq: Every day | ORAL | 0 refills | Status: AC

## 2021-10-25 NOTE — Discharge Instructions (Addendum)
-  Prednisone, 2 pills taken at the same time for 5 days in a row.  Try taking this earlier in the day as it can give you energy. Avoid NSAIDs like ibuprofen and alleve while taking this medication as they can increase your risk of stomach upset and even GI bleeding when in combination with a steroid. You can continue tylenol (acetaminophen) up to 1000mg  3x daily. -Follow-up with PCP in about 1 week, or sooner if symptoms worsen.  -Head to ED if chest pain, shortness of breath, weakness (particularly on one side of your body)

## 2021-10-25 NOTE — ED Triage Notes (Signed)
Pt caregiver c/o "bladder control issues," "balance seems to be getting a little worse," sliding down in chair. Family contacted EMS who advised pt he may be experiencing an infection.   Onset "a while" ago  Caregiver states pt has bottles of medications but does not take any

## 2021-10-25 NOTE — ED Provider Notes (Addendum)
[facility]    CSN: [id number] Arrival date & time: 10/25/21  [id number]      History   Chief Complaint Chief Complaint  Patient presents with   congestion/bladder control/chest congestion    HPI Jason CAVANAUGH Sr. is a 87 y.o. male presenting with cough, urinary symptoms, and feeling "off'.  Medical history hypertension, hyperlipidemia, GERD, arthritis of multiple joints, sensorineural hearing loss.  Here today with son who helps provide history.  Son notes a gradual functional decline over the last few months, including difficulty getting up out of chair, few episodes of urinary incontinence, and cough.  Cough is occasionally productive of clear sputum, but there is no dyspnea, chest pain, dizziness, shortness of breath at rest.  He has a distant history of smoking, but not for over 2 decades.  Denies any formal diagnosis of pulmonary disease, does not use inhalers.  States that he does feel a little off and unsteady, but denies dizziness, focal weakness, syncope, falls.  States that primary care advised him to go to urgent care, but they do plan on following up in about 1 week with them.  Son does state that patient is noncompliant with medication regimen.  HPI  Past Medical History:  Diagnosis Date   Arthritis    hp bone,knees,hands,shoulders   GERD (gastroesophageal reflux disease)    Hyperlipemia    Hypertension    Sleep apnea     Patient Active Problem List   Diagnosis Date Noted   GI bleeding 03/23/2015   Diverticulosis 03/23/2015   Acute blood loss anemia 03/23/2015   Hypertension    GERD (gastroesophageal reflux disease)     Past Surgical History:  Procedure Laterality Date   BUNIONECTOMY Left 05/16/2016       Home Medications    Prior to Admission medications   Medication Sig Start Date End Date Taking? Authorizing Provider  predniSONE (DELTASONE) 20 MG tablet Take 2 tablets (40 mg total) by mouth daily for 5 days. Take with breakfast or lunch.  Avoid NSAIDs (ibuprofen, etc) while taking this medication. 10/25/21 10/30/21 Yes Hazel Sams, PA-C  acetaminophen (TYLENOL ARTHRITIS PAIN) 650 MG CR tablet Take 1 tablet (650 mg total) by mouth every 8 (eight) hours as needed for pain. 07/20/12   Chatten, Lolita Cram, NP  atorvastatin (LIPITOR) 10 MG tablet Take 10 mg by mouth daily.    [provider]  esomeprazole (NEXIUM) 40 MG capsule Take 40 mg by mouth daily at 12 noon.    [provider]  HYDROcodone-ibuprofen (VICOPROFEN) 7.5-200 MG tablet Take 1 tablet by mouth every 6 (six) hours as needed for moderate pain. 05/15/16   Sheard, Myeong O, DPM  losartan (COZAAR) 50 MG tablet Take 50 mg by mouth daily.    [provider]    Family History History reviewed. No pertinent family history.  Social History Social History   Tobacco Use   Smoking status: Never  Substance Use Topics   Alcohol use: No   Drug use: No     Allergies   Patient has no known allergies.   Review of Systems Review of Systems  Constitutional:  Negative for appetite change, chills and fever.  HENT:  Negative for congestion, ear pain, rhinorrhea, sinus pressure, sinus pain and sore throat.   Eyes:  Negative for redness and visual disturbance.  Respiratory:  Positive for cough. Negative for chest tightness, shortness of breath and wheezing.   Cardiovascular:  Negative for chest pain and palpitations.  Gastrointestinal:  Negative for abdominal pain, constipation, diarrhea, nausea and vomiting.  Genitourinary:  Negative for dysuria, frequency and urgency.  Musculoskeletal:  Negative for myalgias.  Neurological:  Negative for dizziness, weakness and headaches.  Psychiatric/Behavioral:  Negative for confusion.   All other systems reviewed and are negative.   Physical Exam Triage Vital Signs ED Triage Vitals  Enc Vitals Group     BP      Pulse      Resp      Temp      Temp src      SpO2      Weight      Height      Head  Circumference      Peak Flow      Pain Score      Pain Loc      Pain Edu?      Excl. in GC?    No data found.  Updated Vital Signs BP 114/73 (BP Location: Right Arm)    Pulse 69    Temp 98.5 F (36.9 C) (Oral)    Resp 18    SpO2 97%   Visual Acuity Right Eye Distance:   Left Eye Distance:   Bilateral Distance:    Right Eye Near:   Left Eye Near:    Bilateral Near:     Physical Exam Vitals reviewed.  Constitutional:      General: He is not in acute distress.    Appearance: Normal appearance. He is not ill-appearing or diaphoretic.  HENT:     Head: Normocephalic and atraumatic.     Right Ear: Tympanic membrane, ear canal and external ear normal. No tenderness. No middle ear effusion. There is no impacted cerumen. Tympanic membrane is not perforated, erythematous, retracted or bulging.     Left Ear: Tympanic membrane, ear canal and external ear normal. No tenderness.  No middle ear effusion. There is no impacted cerumen. Tympanic membrane is not perforated, erythematous, retracted or bulging.     Nose: Nose normal. No congestion.     Mouth/Throat:     Mouth: Mucous membranes are moist.     Pharynx: Uvula midline. No oropharyngeal exudate or posterior oropharyngeal erythema.  Eyes:     Extraocular Movements: Extraocular movements intact.     Pupils: Pupils are equal, round, and reactive to light.  Cardiovascular:     Rate and Rhythm: Normal rate and regular rhythm.     Pulses:          Radial pulses are 2+ on the right side and 2+ on the left side.     Heart sounds: Normal heart sounds.  Pulmonary:     Effort: Pulmonary effort is normal.     Breath sounds: Normal breath sounds. No decreased breath sounds, wheezing, rhonchi or rales.  Abdominal:     Palpations: Abdomen is soft.     Tenderness: There is no abdominal tenderness. There is no right CVA tenderness, left CVA tenderness, guarding or rebound. Negative signs include Murphy's sign, Rovsing's sign and McBurney's sign.      Comments: No pain   Musculoskeletal:     Right lower leg: No edema.     Left lower leg: No edema.  Lymphadenopathy:     Cervical: No cervical adenopathy.     Right cervical: No superficial cervical adenopathy.    Left cervical: No superficial cervical adenopathy.  Skin:    General: Skin is warm.     Capillary Refill: Capillary refill takes less than 2  seconds.  Neurological:     General: No focal deficit present.     Mental Status: He is alert and oriented to person, place, and time.     Comments: CN 2-12 grossly intact, PERRLA, EOMI. Strength 3/5 upper extremities bilaterally and 4/5 lower extremities bilaterally. Negative fingers to thumb, rhomberg.   Psychiatric:        Mood and Affect: Mood normal.        Behavior: Behavior normal.        Thought Content: Thought content normal.        Judgment: Judgment normal.     UC Treatments / Results  Labs (all labs ordered are listed, but only abnormal results are displayed) Labs Reviewed  POCT URINALYSIS DIP (MANUAL ENTRY) - Abnormal; Notable for the following components:      Result Value   Bilirubin, UA small (*)    Ketones, POC UA trace (5) (*)    Spec Grav, UA >=1.030 (*)    Protein Ur, POC =100 (*)    All other components within normal limits  CBC  BASIC METABOLIC PANEL    EKG   Radiology DG Chest 2 View  Result Date: 10/25/2021 CLINICAL DATA:  Cough for 3 months EXAM: CHEST - 2 VIEW COMPARISON:  Chest radiograph 01/17/2010 FINDINGS: The cardiomediastinal silhouette is normal. The lungs hyperinflated consistent with underlying COPD. There is no focal consolidation or pulmonary edema. There is no pleural effusion or pneumothorax. There is no acute osseous abnormality. IMPRESSION: Hyperlucent and hyperinflated lungs suggesting COPD. Otherwise, no radiographic evidence of acute cardiopulmonary process. Electronically Signed   By: Valetta Mole M.D.   On: 10/25/2021 09:36    Procedures Procedures (including critical  care time)  Medications Ordered in UC Medications - No data to display  Initial Impression / Assessment and Plan / UC Course  I have reviewed the triage vital signs and the nursing notes.  Pertinent labs & imaging results that were available during my care of the patient were reviewed by me and considered in my medical decision making (see chart for details).     This patient is a very pleasant 87 y.o. year old male presenting with cough x3 months and what appears to be gradual functional decline. Afebrile, nontachy. No recent URI. Former smoker.   Reassuring neuro exam.  UA - no blood, leuk, nitrite. Did not send culture CXR taken given duration of cough - Hyperlucent and hyperinflated lungs suggesting COPD. Otherwise, no radiographic evidence of acute cardiopulmonary process.  Planned to check a CBC, CMP- were unsuccessful in collecting this due to pt veins.  He can have this drawn at PCP.   Distant history smoking. Trial of low-dose prednisone. Rec f/u with PCP in about 1 week, or sooner if symptoms worsen. Strict ED return precautions discussed. Patient verbalizes understanding and agreement.    Final Clinical Impressions(s) / UC Diagnoses   Final diagnoses:  Cough, unspecified type  Former smoker     Discharge Instructions      -Prednisone, 2 pills taken at the same time for 5 days in a row.  Try taking this earlier in the day as it can give you energy. Avoid NSAIDs like ibuprofen and alleve while taking this medication as they can increase your risk of stomach upset and even GI bleeding when in combination with a steroid. You can continue tylenol (acetaminophen) up to 1000mg  3x daily. -Follow-up with PCP in about 1 week, or sooner if symptoms worsen.  -Head to ED  if chest pain, shortness of breath, weakness (particularly on one side of your body)      ED Prescriptions     Medication Sig Dispense Auth. Provider   predniSONE (DELTASONE) 20 MG tablet Take 2 tablets (40  mg total) by mouth daily for 5 days. Take with breakfast or lunch. Avoid NSAIDs (ibuprofen, etc) while taking this medication. 10 tablet Hazel Sams, PA-C      PDMP not reviewed this encounter.   Hazel Sams, PA-C 10/25/21 1003    Hazel Sams, PA-C 10/25/21 1012

## 2022-01-29 ENCOUNTER — Encounter: Payer: Self-pay | Admitting: *Deleted

## 2022-01-29 ENCOUNTER — Other Ambulatory Visit: Payer: Self-pay | Admitting: *Deleted

## 2022-01-30 ENCOUNTER — Encounter: Payer: Self-pay | Admitting: Psychiatry

## 2022-01-30 ENCOUNTER — Telehealth: Payer: Self-pay | Admitting: Psychiatry

## 2022-01-30 ENCOUNTER — Ambulatory Visit: Payer: PPO | Admitting: Psychiatry

## 2022-01-30 VITALS — BP 130/79 | HR 63 | Ht 66.0 in | Wt 130.0 lb

## 2022-01-30 DIAGNOSIS — G309 Alzheimer's disease, unspecified: Secondary | ICD-10-CM

## 2022-01-30 DIAGNOSIS — R413 Other amnesia: Secondary | ICD-10-CM | POA: Diagnosis not present

## 2022-01-30 DIAGNOSIS — F02B0 Dementia in other diseases classified elsewhere, moderate, without behavioral disturbance, psychotic disturbance, mood disturbance, and anxiety: Secondary | ICD-10-CM | POA: Diagnosis not present

## 2022-01-30 MED ORDER — MEMANTINE HCL 5 MG PO TABS: ORAL_TABLET | ORAL | 3 refills | Status: DC

## 2022-01-30 NOTE — Telephone Encounter (Signed)
HTA order sent to GI, [facility] they will reach out to the patient to schedule.  
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
## 2022-01-30 NOTE — Patient Instructions (Addendum)
MRI brain ?Blood work - B12 and thyroid levels ?3.  Increase Namenda to one pill twice a day for one week. Then take 2 pills in the morning and one pill at bedtime for one week. Then take 2 pills in the morning and 2 pills at night ? week one:  take 1 tablet (5mg ) in the morning and 1 tablet (5 mg) at night ? week two:  take 2 tablets (10mg ) in the morning and take 1 tablet (5 mg) in the evening ? week three: take 2 tablet (10mg ) in the morning and take 2 tablets (10mg ) in the evening ?  ? ?DEMENTIA OVERVIEW ?"Dementia" is a general term for when a person has developed difficulties with reasoning, judgment, and memory. People who have dementia usually have some memory loss as well as difficulty in at least one other area, such as: ??Speaking or writing coherently (or understanding what is said or written) ??Recognizing familiar surroundings ??Planning and carrying out complex or multi-step tasks ?In order to be considered dementia these issues must be severe enough to interfere with a person's independence and daily activities. ?Dementia can be caused by several diseases that affect the brain. The most common cause is Alzheimer disease. Alzheimer disease is present in approximately 60 to 80 percent of all cases of dementia; other degenerative and/or vascular diseases may be present as well, particularly as a person gets older.  ? ?DEMENTIA RISK FACTORS ?There is no way to predict with certainty who will develop dementia. Each form of dementia has its own risk factors, but most forms have several risk factors in common. ?Age -- The biggest risk factor for dementia is age: dementia is rare in people younger than 60 years and becomes very common in people older than 65. For example, dementia affects approximately one in six people between 60 and 67 years old, one in three above 85 years, and almost half of people over age 24. ? ?Family history -- Some forms of dementia have a genetic component, meaning that they tend  to run in families. Having a close family member with Alzheimer disease increases your chances of developing it. People with a first-degree relative (parent or sibling) with Alzheimer disease have a greater chance of developing the disorder. The risk is probably highest if the family member developed Alzheimer disease at a younger age (less than 11 years old) and is lower if the family member did not get Alzheimer disease until late in life. However, families that have a very strong genetic tendency toward Alzheimer disease are uncommon. ? ?Other factors -- Studies indicate that high blood pressure, smoking, and diabetes may be risk factors for dementia. Experts are still not sure how treatment for these problems might influence your risk of developing dementia beyond their benefit of reducing stroke risk. ?Lifestyle factors have also been implicated in dementia. For instance, people who remain physically active, socially connected, and mentally engaged seem less likely to develop dementia than people who do not. These activities may produce more cognitive (mental) reserve or resilience, delaying the emergence of symptoms until an older age. ? ?DEMENTIA SYMPTOMS ?Each form of dementia can cause difficulty with memory, language, reasoning, and judgment, but the symptoms are often very different from person to person. Symptoms also change over time. ?The differences between one form of dementia and another may only be recognizable to skilled health care providers who have experience working with people with dementia. Sometimes family members notice changes but mistakenly attribute them to aging. ? ?  Is memory loss normal? -- Many people worry that memory problems are related to early Alzheimer disease. However, some problems are normal and just related to aging, and do not signify a progressive dementia. Normal age-related changes often cause minor difficulties with immediate memory, for example, remembering a phone  number or a set of directions for a short time. Temporary difficulty recalling proper names, even very familiar ones, is also common with aging. As people age normally, it is common to complain of less efficient and slower processing and learning of new information. Memory changes due to normal aging are usually mild and do not worsen greatly over time, nor should they interfere with a person's day-to-day functioning. ? ?Early changes -- The earliest symptoms of Alzheimer disease are gradual and often subtle. Many people and their families first notice difficulty recalling recent events or information. This often emerges as a tendency to repeat stories or questions or to request or require repetition of material to be able to remember. If you find yourself telling an older family member or friend "I told you that earlier" or "You have told me that more than once," you might begin to suspect Alzheimer disease. Other changes can include one or more of the following: ??Difficulties with language (eg, not being able to find the right words for things) ??Difficulty with concentration and reasoning ??Problems with complex tasks like paying bills, cooking, or balancing a checkbook ??Getting lost in a familiar place ? ?Late changes -- As Alzheimer disease progresses, a person's ability to think clearly continues to decline, and any or all of the changes listed above may be more disruptive. In addition, personality and behavioral symptoms can become quite troublesome. These can include: ??Increased anger or hostility, sometimes aggressive behavior; alternatively, some people become depressed or exhibit little interest in their surroundings (called "apathy") ??Sleep problems ??Hallucinations and/or delusions ??Disorientation ??Needing help with basic tasks (such as eating, bathing, and dressing) ??Incontinence (difficulty controlling the bladder and/or bowels) ? ?The number of symptoms, the functions that are impaired, and  the speed with which symptoms progress can vary widely from one person to the next. In some people, severe dementia occurs within five years of the diagnosis; for others, the progression can take more than 10 years. Most people with Alzheimer disease do not die from the disease itself, but rather from a secondary illness such as pneumonia, bladder infection, or complications of a fall. ? ?DEMENTIA DIAGNOSIS ?To diagnose dementia and identify the type of dementia, health care providers typically rely on the information they can gather by interacting with the person and speaking with their family members. The provider will typically perform memory and other cognitive (thinking) tests to assess the person's degree of difficulty with different types of problems. The results of these tests can then be monitored over time to observe whether functioning stays the same or declines. ? ?Blood tests are usually done to find out if a chemical or hormonal imbalance or vitamin deficiency is contributing to the person's difficulties. Brain scans (usually MRI) are often performed in people with dementia to look for other problems. Sometimes the MRI can also help health care providers identify the type of dementia, since different types can have characteristic brain changes. ? ?SAFETY AND LIFESTYLE ISSUES FOR PEOPLE WITH DEMENTIA ?A major issue for caregivers is making sure the person with dementia stays safe. Because many people with dementia do not realize that their mental functioning is impaired, they try to continue their day-to-day activities as usual.  This can lead to physical danger, and caregivers must help to avoid situations that can threaten the safety of the person or others. ?The following information applies specifically to people with Alzheimer disease, but much of it is also relevant to people with other forms of dementia. ? ?Medications -- People with Alzheimer disease often have trouble remembering to take  medications they are prescribed for other conditions, or they become confused about which medications to take. They are also at increased risk for potentially dangerous side effects from certain medications. Seda

## 2022-01-31 LAB — TSH: TSH: 2.18 u[IU]/mL (ref 0.450–4.500)

## 2022-01-31 LAB — VITAMIN B12: Vitamin B-12: 527 pg/mL (ref 232–1245)

## 2022-02-01 ENCOUNTER — Ambulatory Visit
Admission: RE | Admit: 2022-02-01 | Discharge: 2022-02-01 | Disposition: A | Discharge status: Home / Self Care | Payer: PPO | Source: Ambulatory Visit | Attending: Psychiatry | Admitting: Psychiatry

## 2022-02-01 DIAGNOSIS — R413 Other amnesia: Secondary | ICD-10-CM

## 2022-02-03 ENCOUNTER — Telehealth: Payer: Self-pay | Admitting: Psychiatry

## 2022-02-03 ENCOUNTER — Telehealth: Payer: Self-pay

## 2022-02-03 ENCOUNTER — Other Ambulatory Visit: Payer: Self-pay | Admitting: Psychiatry

## 2022-02-03 DIAGNOSIS — I639 Cerebral infarction, unspecified: Secondary | ICD-10-CM

## 2022-02-03 NOTE — Telephone Encounter (Signed)
HTA no auth req, sent Donna a message she will reach out to the patient to schedule.  ?

## 2022-02-03 NOTE — Telephone Encounter (Signed)
Contacted pt son [name], per DPR, informed him of results and of labs. Advised to call office back with questions as he had none at the time and was appreciative.  ?

## 2022-02-03 NOTE — Telephone Encounter (Signed)
HTA order sent to GI, [facility] they will reach out to the patient to schedule.  

## 2022-02-03 NOTE — Telephone Encounter (Signed)
-----   Message from Ocie Doyne, MD sent at 02/03/2022 10:14 AM EDT ----- ?MRI shows a large old stroke on the right side of his brain. It does not look recent, but it was not on his CT scan in 2018 so it occurred sometime after that. I have placed orders for a stroke workup which includes blood work to check cholesterol and blood sugar, a CT scan to look at the blood vessels in his head and neck, and a heart ultrasound. He should take aspirin 81 mg daily if he is not already. ? ?MRI also shows severe shrinkage of the brain, which is consistent with Alzheimer's disease ?

## 2022-02-03 NOTE — Telephone Encounter (Signed)
Contacted pt, informed him blood work is normal. Advised to call back with questions.   ?

## 2022-02-03 NOTE — Telephone Encounter (Signed)
-----   Message from Genia Harold, MD sent at 02/03/2022 10:07 AM EDT ----- ?Blood work is normal ?

## 2022-02-05 ENCOUNTER — Ambulatory Visit
Admission: RE | Admit: 2022-02-05 | Discharge: 2022-02-05 | Disposition: A | Discharge status: Home / Self Care | Payer: PPO | Source: Ambulatory Visit | Attending: Psychiatry | Admitting: Psychiatry

## 2022-02-05 ENCOUNTER — Other Ambulatory Visit: Payer: Self-pay | Admitting: Psychiatry

## 2022-02-05 ENCOUNTER — Telehealth: Payer: Self-pay

## 2022-02-05 DIAGNOSIS — I639 Cerebral infarction, unspecified: Secondary | ICD-10-CM

## 2022-02-05 MED ORDER — ATORVASTATIN CALCIUM 40 MG PO TABS: 40.0000 mg | ORAL_TABLET | Freq: Every day | ORAL | 6 refills | Status: DC

## 2022-02-05 MED ORDER — IOPAMIDOL (ISOVUE-370) INJECTION 76%
75.0000 mL | Freq: Once | INTRAVENOUS | Status: AC | PRN
  Administered 2022-02-05: 75 mL via INTRAVENOUS

## 2022-02-05 NOTE — Telephone Encounter (Signed)
Contacted pt son, per DPR, informed him CTA shows narrowing of his blood vessels on the right side of his brain, which was likely the cause of his stroke. Dr Billey Gosling sent prescription to increase his lipitor to 40 mg daily to help lower his cholesterol. It is important he continue take aspirin 81 mg daily for stroke prevention. Advised to call office back with questions and he was appreciative.  ?

## 2022-02-05 NOTE — Telephone Encounter (Signed)
-----   Message from Ocie Doyne, MD sent at 02/05/2022  3:40 PM EDT ----- ?CTA shows narrowing of his blood vessels on the right side of his brain, which was likely the cause of his stroke. I will send in a prescription to increase his lipitor to 40 mg daily to help lower his cholesterol. It is important he continue take aspirin 81 mg daily for stroke prevention. ?

## 2022-02-19 ENCOUNTER — Ambulatory Visit (HOSPITAL_COMMUNITY)
Admission: RE | Admit: 2022-02-19 | Discharge: 2022-02-19 | Disposition: A | Discharge status: Home / Self Care | Payer: PPO | Source: Ambulatory Visit | Attending: Psychiatry | Admitting: Psychiatry

## 2022-02-19 DIAGNOSIS — I6389 Other cerebral infarction: Secondary | ICD-10-CM

## 2022-02-19 DIAGNOSIS — I639 Cerebral infarction, unspecified: Secondary | ICD-10-CM | POA: Diagnosis not present

## 2022-02-19 DIAGNOSIS — I10 Essential (primary) hypertension: Secondary | ICD-10-CM | POA: Insufficient documentation

## 2022-02-19 DIAGNOSIS — I081 Rheumatic disorders of both mitral and tricuspid valves: Secondary | ICD-10-CM | POA: Insufficient documentation

## 2022-02-19 DIAGNOSIS — E785 Hyperlipidemia, unspecified: Secondary | ICD-10-CM | POA: Insufficient documentation

## 2022-02-19 DIAGNOSIS — G4730 Sleep apnea, unspecified: Secondary | ICD-10-CM | POA: Insufficient documentation

## 2022-02-19 LAB — ECHOCARDIOGRAM COMPLETE BUBBLE STUDY
Area-P 1/2: 2.6 cm2
Calc EF: 64 %
S' Lateral: 2.5 cm
Single Plane A2C EF: 63.3 %
Single Plane A4C EF: 65.1 %

## 2022-02-19 NOTE — Progress Notes (Signed)
?  Echocardiogram ?2D Echocardiogram has been performed. ? ?Augustine Radar ?02/19/2022, 11:01 AM ?

## 2022-02-25 ENCOUNTER — Telehealth: Payer: Self-pay

## 2022-02-25 NOTE — Telephone Encounter (Signed)
I called patient. I spoke with patient's son, [name], per [place]. I advised him that patient's echocardiogram showed no significant abnormalities. Patient's son verbalized understanding. ?

## 2022-02-25 NOTE — Telephone Encounter (Signed)
-----   Message from Marcial Pacas, MD sent at 02/25/2022 11:33 AM EDT ----- ?Please call patient, echocardiogram showed no significant abnormalities. ?

## 2022-04-22 ENCOUNTER — Telehealth: Payer: Self-pay | Admitting: Psychiatry

## 2022-07-25 ENCOUNTER — Ambulatory Visit
Admission: RE | Admit: 2022-07-25 | Discharge: 2022-07-25 | Disposition: A | Discharge status: Home / Self Care | Payer: PPO | Source: Ambulatory Visit | Attending: Pulmonary Disease | Admitting: Pulmonary Disease

## 2022-07-25 ENCOUNTER — Other Ambulatory Visit: Payer: Self-pay | Admitting: Pulmonary Disease

## 2022-07-25 DIAGNOSIS — J9809 Other diseases of bronchus, not elsewhere classified: Secondary | ICD-10-CM

## 2022-07-25 DIAGNOSIS — J42 Unspecified chronic bronchitis: Secondary | ICD-10-CM

## 2022-07-25 DIAGNOSIS — J3489 Other specified disorders of nose and nasal sinuses: Secondary | ICD-10-CM

## 2022-08-05 ENCOUNTER — Ambulatory Visit: Payer: PPO | Admitting: Psychiatry

## 2022-08-05 VITALS — BP 144/85 | HR 66 | Ht 66.0 in | Wt 125.1 lb

## 2022-08-05 DIAGNOSIS — R2689 Other abnormalities of gait and mobility: Secondary | ICD-10-CM

## 2022-08-05 DIAGNOSIS — G309 Alzheimer's disease, unspecified: Secondary | ICD-10-CM | POA: Diagnosis not present

## 2022-08-05 DIAGNOSIS — I639 Cerebral infarction, unspecified: Secondary | ICD-10-CM | POA: Diagnosis not present

## 2022-08-05 DIAGNOSIS — F0280 Dementia in other diseases classified elsewhere without behavioral disturbance: Secondary | ICD-10-CM

## 2022-08-05 MED ORDER — MEMANTINE HCL 5 MG PO TABS: 5.0000 mg | ORAL_TABLET | Freq: Two times a day (BID) | ORAL | 6 refills | Status: DC

## 2022-08-05 NOTE — Progress Notes (Signed)
   CC:  memory loss  Follow-up Visit  Last visit: 01/30/22  Brief HPI: 88 year old male with a history of glaucoma, HTN, HLD who follows in clinic for dementia.  At his last visit, brain MRI was ordered. Namenda was uptitrated to 10 mg BID  Interval History: Brain MRI 02/01/22 showed a remote right temporal stroke, not noted on prior CT scan in 2018. It also showed moderately severe cortical atrophy most pronounced in medial temporal lobes, consistent with Alzheimer's dementia.  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 His lipitor was increased to 40 mg daily and he was started on ASA for stroke prevention.  TTE showed EF 60-65% and was negative for shunt.  He never increased his dose of Namenda. Continues to take 5 mg daily. Feels memory is about the same since his last visit. He did have a fall a couple of months ago. Stumbled while walking and hit the ground with his knees. Did not hit his head. Son notices he tends to slide his feet while walking.  Physical Exam:   Vital Signs: BP (!) 144/85   Pulse 66   Ht 5\' 6"  (1.676 m)   Wt 125 lb 2 oz (56.8 kg)   BMI 20.20 kg/m  GENERAL:  well appearing, in no acute distress, alert  SKIN:  Color, texture, turgor normal. No rashes or lesions HEAD:  Normocephalic/atraumatic. RESP: normal respiratory effort MSK:  No gross joint deformities.   NEUROLOGICAL: Mental Status:     08/05/2022    1:19 PM 01/30/2022    1:45 PM  Montreal Cognitive Assessment   Visuospatial/ Executive (0/5) 0 0  Naming (0/3) 0 2  Attention: Read list of digits (0/2) 2 2  Attention: Read list of letters (0/1) 1 0  Attention: Serial 7 subtraction starting at 100 (0/3) 2 3  Language: Repeat phrase (0/2) 2 1  Language : Fluency (0/1) 0 0  Abstraction (0/2) 1 2  Delayed Recall (0/5) 0 0  Orientation (0/6) 2 2  Total 10 12   Cranial Nerves: PERRL, face symmetric, no dysarthria, hearing grossly  intact Motor: moves all extremities equally Gait: narrow-based, normal stride length  IMPRESSION: 88 year old male with a history of  glaucoma, HTN, HLD, CVA who follows in clinic for dementia. Will check lipid panel and A1c today. In the meantime will continue lipitor 40 mg daily and ASA 81 mg daily for stroke prevention. Will increase Namenda to 5 mg BID for now. Referral to physical therapy placed to help with his gait and balance.  PLAN: -Blood work: LDL, A1c -Continue atorvastatin 40 mg daily, ASA 81 mg daily for stroke prevention -Continue Namenda 5 mg BID -Referral to PT for gait and balance  Follow-up: 6 months  I spent a total of 21 minutes on the date of the service. Discussed medication side effects, adverse reactions and drug interactions. Written educational materials and patient instructions outlining all of the above were given.  Genia Harold, MD 08/05/22 1:53 PM

## 2022-08-06 ENCOUNTER — Telehealth: Payer: Self-pay | Admitting: *Deleted

## 2022-08-06 LAB — HEMOGLOBIN A1C
Est. average glucose Bld gHb Est-mCnc: 114 mg/dL
Hgb A1c MFr Bld: 5.6 % (ref 4.8–5.6)

## 2022-08-06 LAB — LIPID PANEL
Chol/HDL Ratio: 2.2 ratio (ref 0.0–5.0)
Cholesterol, Total: 131 mg/dL (ref 100–199)
HDL: 60 mg/dL (ref >39)
LDL Chol Calc (NIH): 59 mg/dL (ref 0–99)
Triglycerides: 54 mg/dL (ref 0–149)
VLDL Cholesterol Cal: 12 mg/dL (ref 5–40)

## 2022-08-06 NOTE — Telephone Encounter (Signed)
Called patient and informed him Lipid panel and A1c levels are normal. He can continue taking his medications as prescribed. Patient verbalized understanding, appreciation.

## 2022-08-18 ENCOUNTER — Ambulatory Visit: Payer: PPO | Attending: Psychiatry | Admitting: Physical Therapy

## 2022-08-18 DIAGNOSIS — R2689 Other abnormalities of gait and mobility: Secondary | ICD-10-CM | POA: Diagnosis present

## 2022-08-18 DIAGNOSIS — M6281 Muscle weakness (generalized): Secondary | ICD-10-CM | POA: Diagnosis not present

## 2022-08-18 DIAGNOSIS — R2681 Unsteadiness on feet: Secondary | ICD-10-CM | POA: Diagnosis present

## 2022-08-18 NOTE — Therapy (Addendum)
OUTPATIENT PHYSICAL THERAPY NEURO EVALUATION   Patient Name: Jason WEISENSEL Sr. MRN: [id number] DOB:1931/05/09, 88 y.o., male Today's Date: 08/18/2022   PCP: Corine Shelter, MD REFERRING PROVIDER: Ocie Doyne, MD    PT End of Session - 08/18/22 1317     Visit Number 1    Number of Visits 13   with eval   Date for PT Re-Evaluation 10/13/22    Authorization Type [employer]    Progress Note Due on Visit 10    PT Start Time 1315    PT Stop Time 1350   eval   PT Time Calculation (min) 35 min    Activity Tolerance Patient tolerated treatment well    Behavior During Therapy [facility] for tasks assessed/performed;Flat affect             Past Medical History:  Diagnosis Date   Arthritis    hp bone,knees,hands,shoulders   BPH (benign prostatic hyperplasia)    Diverticulosis    GERD (gastroesophageal reflux disease)    Glaucoma    Hyperlipemia    Hypertension    Loss of appetite    MCI (mild cognitive impairment)    Memory impairment    Sleep apnea    Past Surgical History:  Procedure Laterality Date   BUNIONECTOMY Left 05/16/2016   CATARACT EXTRACTION Bilateral 02/2013   TRANSURETHRAL RESECTION OF PROSTATE  1997   Patient Active Problem List   Diagnosis Date Noted   GI bleeding 03/23/2015   Diverticulosis 03/23/2015   Acute blood loss anemia 03/23/2015   Hypertension    GERD (gastroesophageal reflux disease)     ONSET DATE: 08/05/2022   REFERRING DIAG: R26.89 (ICD-10-CM) - Imbalance   THERAPY DIAG:  Muscle weakness (generalized)  Other abnormalities of gait and mobility  Unsteadiness on feet  Rationale for Evaluation and Treatment Rehabilitation  SUBJECTIVE:                                                                                                                                                                                             SUBJECTIVE STATEMENT: Pt's son [name] reports that the pt has been scooting his feet when he  is walking and any little rock or pebble will throw off his balance. Pt reports he is interested in setting up an exercise program to work on at the [facility] upon d/c from PT.  Pt accompanied by: family member son [name]  PERTINENT HISTORY: glaucoma, HTN, HLD, Alzheimer's   PAIN:  Are you having pain? No  PRECAUTIONS: Fall  WEIGHT BEARING RESTRICTIONS: No  FALLS: Has patient fallen in last 6 months? Yes. Number of falls  1: fell walking on driveway (fell forwards, got scraped up but no broken bones)  LIVING ENVIRONMENT: Lives with: lives with their family (other son) Lives in: House/apartment Stairs: Yes: External: 4-5 steps; none Has following equipment at home: None  PLOF: Independent with gait and Independent with transfers; doesn't stay at home alone for longer than 1-2 hours (pt's family has cameras installed at home)  PATIENT GOALS: strengthen legs, pick up feet better with walking  OBJECTIVE:   DIAGNOSTIC FINDINGS:  Brain MRI 02/01/22 showed a remote right temporal stroke, not noted on prior CT scan in 2018. It also showed moderately severe cortical atrophy most pronounced in medial temporal lobes, consistent with Alzheimer's dementia.   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 His lipitor was increased to 40 mg daily and he was started on ASA for stroke prevention.   COGNITION: Overall cognitive status: History of cognitive impairments - at baseline (Alzheimer's)   SENSATION: None per patient report  COORDINATION: WFL, decreased speed of movements  POSTURE: rounded shoulders and forward head  LOWER EXTREMITY MMT:    MMT Right Eval Left Eval  Hip flexion 5 5  Hip extension    Hip abduction    Hip adduction    Hip internal rotation    Hip external rotation    Knee flexion 5 5  Knee extension 5 5  Ankle dorsiflexion 5 5  Ankle plantarflexion    Ankle inversion    Ankle eversion    (Blank rows =  not tested)  BED MOBILITY:  WFL per family report  TRANSFERS: Assistive device utilized: None  Sit to stand: Modified independence *increased time needed to complete Stand to sit: Modified independence Chair to chair: Modified independence Floor:  not assessed at eval  STAIRS: Level of Assistance: SBA Stair Negotiation Technique: Alternating Pattern  with Single Rail on Right Number of Stairs: 4  Height of Stairs: 6  Comments: WFL  GAIT: Gait pattern: decreased arm swing- Right, decreased arm swing- Left, decreased step length- Right, decreased step length- Left, decreased stride length, decreased hip/knee flexion- Right, decreased hip/knee flexion- Left, decreased ankle dorsiflexion- Right, decreased ankle dorsiflexion- Left, shuffling, lateral lean- Right, lateral lean- Left, trunk flexed, poor foot clearance- Right, and poor foot clearance- Left Distance walked: various clinic distances Assistive device utilized: None Level of assistance: Modified independence *increased time Comments: decreased speed with increase in distance; noticeable lateral sway or "bobbling", anterior lean  FUNCTIONAL TESTS:    [facility] PT Assessment - 08/18/22 1326       Ambulation/Gait   Gait velocity 32.8 ft over 17.56 sec = 1.87 ft/sec      Standardized Balance Assessment   Standardized Balance Assessment Timed Up and Go Test;Five Times Sit to Stand    Five times sit to stand comments  24.31 sec   with BUE pushing up from arms of chair     Timed Up and Go Test   TUG Normal TUG    Normal TUG (seconds) 21.15      Functional Gait  Assessment   Gait assessed  Yes    Gait Level Surface Walks 20 ft, slow speed, abnormal gait pattern, evidence for imbalance or deviates 10-15 in outside of the 12 in walkway width. Requires more than 7 sec to ambulate 20 ft.    Change in Gait Speed Makes only minor adjustments to walking speed, or accomplishes a change in speed with significant gait deviations, deviates  10-15 in outside the 12 in walkway width, or changes speed but loses balance but is able to recover and continue walking.    Gait with Horizontal Head Turns Performs head turns with moderate changes in gait velocity, slows down, deviates 10-15 in outside 12 in walkway width but recovers, can continue to walk.    Gait with Vertical Head Turns Performs task with moderate change in gait velocity, slows down, deviates 10-15 in outside 12 in walkway width but recovers, can continue to walk.    Gait and Pivot Turn Turns slowly, requires verbal cueing, or requires several small steps to catch balance following turn and stop    Step Over Obstacle Cannot perform without assistance.    Gait with Narrow Base of Support Ambulates less than 4 steps heel to toe or cannot perform without assistance.    Gait with Eyes Closed Cannot walk 20 ft without assistance, severe gait deviations or imbalance, deviates greater than 15 in outside 12 in walkway width or will not attempt task.    Ambulating Backwards Cannot walk 20 ft without assistance, severe gait deviations or imbalance, deviates greater than 15 in outside 12 in walkway width or will not attempt task.    Steps Alternating feet, must use rail.    Total Score 7    FGA comment: 7/30, high fall risk             TODAY'S TREATMENT:   PT evaluation  PATIENT EDUCATION: Education details: Eval findings, PT POC Person educated: Patient and Child(ren) Education method: Explanation and Demonstration Education comprehension: verbalized understanding and needs further education  HOME EXERCISE PROGRAM: To be established next session  GOALS: Goals reviewed with patient? Yes  SHORT TERM GOALS: Target date: 09/08/2022  Pt will be independent with initial HEP for improved strength, balance, transfers and gait. Baseline: Goal status: INITIAL  2.  Pt will improve gait velocity to at least 1.95 ft/sec for improved gait efficiency and performance at mod I level   Baseline: 1.87 ft/sec Goal status: INITIAL  3.  Pt will improve normal TUG to less than or equal to 18 seconds for improved functional mobility and decreased fall risk. Baseline: 21.15 sec no AD Goal status: INITIAL  4.  Pt will improve FGA to 10/30 for decreased fall risk  Baseline: 7/30 Goal status: INITIAL   LONG TERM GOALS: Target date: 09/29/2022  Pt will be independent with final HEP for improved strength, balance, transfers and gait. Baseline:  Goal status: INITIAL  2.  Pt will improve gait velocity to at least 2.0 ft/sec for improved gait efficiency and performance at mod I level  Baseline: 1.87 ft/sec Goal status: INITIAL  3.  Pt will improve normal TUG to less than or equal to 15 seconds for improved functional mobility and decreased fall risk. Baseline: 21.15 sec no AD Goal status: INITIAL  4.  Pt will improve 5 x STS to less than or equal to 20 seconds to demonstrate improved functional strength and transfer efficiency.  Baseline: 24.31 Goal status: INITIAL  5.  Pt will improve FGA to 15/30 for decreased fall risk  Baseline: 7/30 Goal status: INITIAL   ASSESSMENT:  CLINICAL IMPRESSION: Patient is a 88 year old male referred to Neuro OPPT for imbalance.   Pt's PMH is significant for: glaucoma, HTN, HLD, Alzheimer's.  The following deficits were present during the exam: decreased gait speed, decreased functional LE strength, decreased balance, and increased fall risk. Based on his gait speed of 1.87 ft/sec,  TUG score of 21.15 sec, 5xSTS score of 24.31 sec, FGA score of 7/30, and history of falls, pt is an increased risk for falls. Pt would benefit from skilled PT to address these impairments and functional limitations to maximize functional mobility independence.   OBJECTIVE IMPAIRMENTS: Abnormal gait, decreased balance, decreased cognition, decreased endurance, decreased knowledge of condition, and decreased safety awareness.   ACTIVITY LIMITATIONS: stairs and  transfers  PARTICIPATION LIMITATIONS: driving, shopping, and community activity  PERSONAL FACTORS: Age and 1-2 comorbidities:    glaucoma, HTN, HLD, and Alzheimer's are also affecting patient's functional outcome.   REHAB POTENTIAL: Good  CLINICAL DECISION MAKING: Stable/uncomplicated  EVALUATION COMPLEXITY: Low  PLAN:  PT FREQUENCY: 2x/week  PT DURATION: 8 weeks (13 visits total, to allow for scheduling conflicts)  PLANNED INTERVENTIONS: Therapeutic exercises, Therapeutic activity, Neuromuscular re-education, Balance training, Gait training, Patient/Family education, Self Care, Joint mobilization, Joint manipulation, Stair training, Vestibular training, Canalith repositioning, Visual/preceptual remediation/compensation, Orthotic/Fit training, DME instructions, Aquatic Therapy, Dry Needling, Cognitive remediation, Electrical stimulation, Spinal manipulation, Spinal mobilization, Cryotherapy, Moist heat, Taping, Manual therapy, and Re-evaluation  PLAN FOR NEXT SESSION: initiate HEP for standing balance based on FGA impairments (backwards gait, tandem, gait with head turns, narrow BOS), initial sit to stand, increasing B step length (hurdles, agility ladder), work towards setting up exercise program he can do at [employer], PT, DPT, [place] 08/18/2022, 4:36 PM

## 2022-08-20 ENCOUNTER — Other Ambulatory Visit: Payer: Self-pay | Admitting: Psychiatry

## 2022-08-27 ENCOUNTER — Ambulatory Visit: Payer: PPO | Attending: Psychiatry | Admitting: Physical Therapy

## 2022-08-27 ENCOUNTER — Encounter: Payer: Self-pay | Admitting: Physical Therapy

## 2022-08-27 DIAGNOSIS — R2681 Unsteadiness on feet: Secondary | ICD-10-CM | POA: Insufficient documentation

## 2022-08-27 DIAGNOSIS — R2689 Other abnormalities of gait and mobility: Secondary | ICD-10-CM | POA: Insufficient documentation

## 2022-08-27 DIAGNOSIS — M6281 Muscle weakness (generalized): Secondary | ICD-10-CM | POA: Diagnosis not present

## 2022-08-27 NOTE — Therapy (Signed)
OUTPATIENT PHYSICAL THERAPY NEURO TREATMENT   Patient Name: Jason GADD Sr. MRN: [id number] DOB:[birth year], 88 y.o., male Today's Date: 08/27/2022   PCP: Vincente Liberty, MD REFERRING PROVIDER: Genia Harold, MD    PT End of Session - 08/27/22 0940     Visit Number 2    Number of Visits 13   with eval   Date for PT Re-Evaluation 10/13/22    Authorization Type [employer]    Progress Note Due on Visit 10    PT Start Time [phone]   pt late   PT Stop Time 1015    PT Time Calculation (min) 38 min    Activity Tolerance Patient tolerated treatment well    Behavior During Therapy [facility] for tasks assessed/performed;Flat affect             Past Medical History:  Diagnosis Date   Arthritis    hp bone,knees,hands,shoulders   BPH (benign prostatic hyperplasia)    Diverticulosis    GERD (gastroesophageal reflux disease)    Glaucoma    Hyperlipemia    Hypertension    Loss of appetite    MCI (mild cognitive impairment)    Memory impairment    Sleep apnea    Past Surgical History:  Procedure Laterality Date   BUNIONECTOMY Left 05/16/2016   CATARACT EXTRACTION Bilateral 02/2013   TRANSURETHRAL RESECTION OF PROSTATE  1997   Patient Active Problem List   Diagnosis Date Noted   GI bleeding 03/23/2015   Diverticulosis 03/23/2015   Acute blood loss anemia 03/23/2015   Hypertension    GERD (gastroesophageal reflux disease)     ONSET DATE: 08/05/2022   REFERRING DIAG: R26.89 (ICD-10-CM) - Imbalance   THERAPY DIAG:  Muscle weakness (generalized)  Other abnormalities of gait and mobility  Unsteadiness on feet  Rationale for Evaluation and Treatment Rehabilitation  SUBJECTIVE:                                                                                                                                                                                             SUBJECTIVE STATEMENT: Pt's son Mliss Fritz waits in the lobby.  Pt denies falls or acute changes.   Pt does ambulate into clinic appearing very distracted and shuffling bilateral feet.  Pt accompanied by: family member son Mliss Fritz  PERTINENT HISTORY: glaucoma, HTN, HLD, Alzheimer's   PAIN:  Are you having pain? No  PRECAUTIONS: Fall  WEIGHT BEARING RESTRICTIONS: No  FALLS: Has patient fallen in last 6 months? Yes. Number of falls 1: fell walking on driveway (fell forwards, got scraped up but no broken bones)  LIVING ENVIRONMENT: Lives with: lives  with their family (other son) Lives in: House/apartment Stairs: Yes: External: 4-5 steps; none Has following equipment at home: None  PLOF: Independent with gait and Independent with transfers; doesn't stay at home alone for longer than 1-2 hours (pt's family has cameras installed at home)  PATIENT GOALS: strengthen legs, pick up feet better with walking  OBJECTIVE:   TODAY'S TREATMENT:   At countertop: -backwards walking 4x10' w/ cues for improved foot clearance -tandem stance 2x30 sec w/ 2 fingertip support > tandem walking 6x10' w/ max multimodal cuing and demonstration w/ moderate redirection to task -walking w/ head turns 4x10' w/ pt demonstrating improved engagement to task w/ inc repetition In // bars: -standing march w/ emphasis on maintained high knees x30 -standing hip abduction x12 each side -mini squat w/ BUE support x12, cued to prevent fwd trunk flexion -Standing feet together eyes open x30 sec > standing feet together eyes closed x21 sec + x12 sec w/ mild trunk sway, pt requires frequent cues to keep eyes closed for prolonged period  PATIENT EDUCATION: Education details: Initial HEP -perform w/ supervision.  Met son in lobby for brief explanation. Person educated: Patient and Child(ren) Education method: Customer service manager Education comprehension: verbalized understanding and needs further education  HOME EXERCISE PROGRAM: Access Code: [email or web address] URL: [email or web address] Date:  08/27/2022 Prepared by: Elease Etienne  Exercises - Walking with Head Rotation  - 1 x daily - 4-5 x weekly - 3 sets - 10 reps - Tandem Walking with Counter Support  - 1 x daily - 4-5 x weekly - 3 sets - 10 reps - Backward Walking with Counter Support  - 1 x daily - 4-5 x weekly - 3 sets - 10 reps  GOALS: Goals reviewed with patient? Yes  SHORT TERM GOALS: Target date: 09/08/2022  Pt will be independent with initial HEP for improved strength, balance, transfers and gait. Baseline: Goal status: INITIAL  2.  Pt will improve gait velocity to at least 1.95 ft/sec for improved gait efficiency and performance at mod I level  Baseline: 1.87 ft/sec Goal status: INITIAL  3.  Pt will improve normal TUG to less than or equal to 18 seconds for improved functional mobility and decreased fall risk. Baseline: 21.15 sec no AD Goal status: INITIAL  4.  Pt will improve FGA to 10/30 for decreased fall risk  Baseline: 7/30 Goal status: INITIAL   LONG TERM GOALS: Target date: 09/29/2022  Pt will be independent with final HEP for improved strength, balance, transfers and gait. Baseline:  Goal status: INITIAL  2.  Pt will improve gait velocity to at least 2.0 ft/sec for improved gait efficiency and performance at mod I level  Baseline: 1.87 ft/sec Goal status: INITIAL  3.  Pt will improve normal TUG to less than or equal to 15 seconds for improved functional mobility and decreased fall risk. Baseline: 21.15 sec no AD Goal status: INITIAL  4.  Pt will improve 5 x STS to less than or equal to 20 seconds to demonstrate improved functional strength and transfer efficiency.  Baseline: 24.31 Goal status: INITIAL  5.  Pt will improve FGA to 15/30 for decreased fall risk  Baseline: 7/30 Goal status: INITIAL   ASSESSMENT:  CLINICAL IMPRESSION: Pt maintains tendency to shuffle during ambulatory tasks this session requiring focus on foot clearance.  Some redirection to tasks performed as pt is  mildly distractible in open gym environment.  Initiated HEP focused on deficits noted in FGA during evaluation.  Will continue to address  safety with functional mobility, improved performance of gait, and generalized strength gains according to ongoing skilled PT POC.   OBJECTIVE IMPAIRMENTS: Abnormal gait, decreased balance, decreased cognition, decreased endurance, decreased knowledge of condition, and decreased safety awareness.   ACTIVITY LIMITATIONS: stairs and transfers  PARTICIPATION LIMITATIONS: driving, shopping, and community activity  PERSONAL FACTORS: Age and 1-2 comorbidities:    glaucoma, HTN, HLD, and Alzheimer's are also affecting patient's functional outcome.   REHAB POTENTIAL: Good  CLINICAL DECISION MAKING: Stable/uncomplicated  EVALUATION COMPLEXITY: Low  PLAN:  PT FREQUENCY: 2x/week  PT DURATION: 8 weeks (13 visits total, to allow for scheduling conflicts)  PLANNED INTERVENTIONS: Therapeutic exercises, Therapeutic activity, Neuromuscular re-education, Balance training, Gait training, Patient/Family education, Self Care, Joint mobilization, Joint manipulation, Stair training, Vestibular training, Canalith repositioning, Visual/preceptual remediation/compensation, Orthotic/Fit training, DME instructions, Aquatic Therapy, Dry Needling, Cognitive remediation, Electrical stimulation, Spinal manipulation, Spinal mobilization, Cryotherapy, Moist heat, Taping, Manual therapy, and Re-evaluation  PLAN FOR NEXT SESSION: initiate HEP for standing balance based on FGA impairments (backwards gait, tandem, gait with head turns, narrow BOS), initial sit to stand, increasing B step length (hurdles, agility ladder), work towards setting up exercise program he can do at [facility], foot clearance, ankle and stepping strategy as pt demonstrates none during LOB w/ static balance.   Bary Richard, PT, DPT 08/27/2022, 10:26 AM

## 2022-08-29 ENCOUNTER — Ambulatory Visit: Payer: PPO | Admitting: Physical Therapy

## 2022-08-29 DIAGNOSIS — R2681 Unsteadiness on feet: Secondary | ICD-10-CM

## 2022-08-29 DIAGNOSIS — M6281 Muscle weakness (generalized): Secondary | ICD-10-CM

## 2022-08-29 DIAGNOSIS — R2689 Other abnormalities of gait and mobility: Secondary | ICD-10-CM

## 2022-08-29 NOTE — Therapy (Signed)
OUTPATIENT PHYSICAL THERAPY NEURO TREATMENT   Patient Name: Jason NOURI Sr. MRN: [id number] DOB:08/01/1931, 88 y.o., male Today's Date: 08/29/2022   PCP: Corine Shelter, MD REFERRING PROVIDER: Ocie Doyne, MD    PT End of Session - 08/29/22 1018     Visit Number 3    Number of Visits 13   with eval   Date for PT Re-Evaluation 10/13/22    Authorization Type [employer]    Progress Note Due on Visit 10    PT Start Time 1015    PT Stop Time 1057    PT Time Calculation (min) 42 min    Equipment Utilized During Treatment Gait belt    Activity Tolerance Patient tolerated treatment well    Behavior During Therapy WFL for tasks assessed/performed;Flat affect              Past Medical History:  Diagnosis Date   Arthritis    hp bone,knees,hands,shoulders   BPH (benign prostatic hyperplasia)    Diverticulosis    GERD (gastroesophageal reflux disease)    Glaucoma    Hyperlipemia    Hypertension    Loss of appetite    MCI (mild cognitive impairment)    Memory impairment    Sleep apnea    Past Surgical History:  Procedure Laterality Date   BUNIONECTOMY Left 05/16/2016   CATARACT EXTRACTION Bilateral 02/2013   TRANSURETHRAL RESECTION OF PROSTATE  1997   Patient Active Problem List   Diagnosis Date Noted   GI bleeding 03/23/2015   Diverticulosis 03/23/2015   Acute blood loss anemia 03/23/2015   Hypertension    GERD (gastroesophageal reflux disease)     ONSET DATE: 08/05/2022   REFERRING DIAG: R26.89 (ICD-10-CM) - Imbalance   THERAPY DIAG:  Muscle weakness (generalized)  Unsteadiness on feet  Other abnormalities of gait and mobility  Rationale for Evaluation and Treatment Rehabilitation  SUBJECTIVE:                                                                                                                                                                                             SUBJECTIVE STATEMENT: Pt reports no acute changes  since last session, no pain today. Reports his HEP is going well and he is working on it at home.  Pt accompanied by: family member son [name] (in lobby)  PERTINENT HISTORY: glaucoma, HTN, HLD, Alzheimer's   PAIN:  Are you having pain? No  PRECAUTIONS: Fall  WEIGHT BEARING RESTRICTIONS: No  FALLS: Has patient fallen in last 6 months? Yes. Number of falls 1: fell walking on driveway (fell forwards, got scraped up but no broken  bones)  LIVING ENVIRONMENT: Lives with: lives with their family (other son) Lives in: House/apartment Stairs: Yes: External: 4-5 steps; none Has following equipment at home: None  PLOF: Independent with gait and Independent with transfers; doesn't stay at home alone for longer than 1-2 hours (pt's family has cameras installed at home)  PATIENT GOALS: strengthen legs, pick up feet better with walking  OBJECTIVE:   TODAY'S TREATMENT:   THER ACT: Agility ladder: Forwards with step-to gait pattern initially Progression to forwards step-through gait pattern with mod cueing needed to increase step length After use of agility ladder performed gait around therapy gym (x 115 ft at mod I level) with focus on increased step length, mod cues needed to increase step length  THER EX: SciFit  level 3 for 8 minutes using BUE/BLEs for neural priming for reciprocal movement, dynamic cardiovascular warmup and increased amplitude of stepping.  NMR: Alt L/R beam step-overs (fwd/back) 2 x 10 reps with min A for balance, most difficulty with stepping backwards over beam Added in 3# ankle weights 2 x 10 reps; decreased standing balance noted and decreased control of LEs with onset of fatigue Removed ankle weights, 1 x 10 reps with increased amplititude of stepping noted After working with beam step-overs followed up again with gait around therapy gym (x 115 ft at mod I level), pt does exhibit improved ability to ambulate with increased step length   PATIENT  EDUCATION: Education details: continue HEP, continue to work on increasing step length during gait Person educated: Patient Education method: Customer service manager Education comprehension: verbalized understanding and needs further education  HOME EXERCISE PROGRAM: Access Code: [facility] URL: [email or web address] Date: 08/27/2022 Prepared by: Elease Etienne  Exercises - Walking with Head Rotation  - 1 x daily - 4-5 x weekly - 3 sets - 10 reps - Tandem Walking with Counter Support  - 1 x daily - 4-5 x weekly - 3 sets - 10 reps - Backward Walking with Counter Support  - 1 x daily - 4-5 x weekly - 3 sets - 10 reps   GOALS: Goals reviewed with patient? Yes  SHORT TERM GOALS: Target date: 09/08/2022  Pt will be independent with initial HEP for improved strength, balance, transfers and gait. Baseline: Goal status: INITIAL  2.  Pt will improve gait velocity to at least 1.95 ft/sec for improved gait efficiency and performance at mod I level  Baseline: 1.87 ft/sec Goal status: INITIAL  3.  Pt will improve normal TUG to less than or equal to 18 seconds for improved functional mobility and decreased fall risk. Baseline: 21.15 sec no AD Goal status: INITIAL  4.  Pt will improve FGA to 10/30 for decreased fall risk  Baseline: 7/30 Goal status: INITIAL   LONG TERM GOALS: Target date: 09/29/2022  Pt will be independent with final HEP for improved strength, balance, transfers and gait. Baseline:  Goal status: INITIAL  2.  Pt will improve gait velocity to at least 2.0 ft/sec for improved gait efficiency and performance at mod I level  Baseline: 1.87 ft/sec Goal status: INITIAL  3.  Pt will improve normal TUG to less than or equal to 15 seconds for improved functional mobility and decreased fall risk. Baseline: 21.15 sec no AD Goal status: INITIAL  4.  Pt will improve 5 x STS to less than or equal to 20 seconds to demonstrate improved functional strength  and transfer efficiency.  Baseline: 24.31 Goal status: INITIAL  5.  Pt will improve FGA to 15/30  for decreased fall risk  Baseline: 7/30 Goal status: INITIAL   ASSESSMENT:  CLINICAL IMPRESSION: Emphasis of skilled PT session on working on neural priming via use of SciFit followed by practice working on increasing step length during gait with use of agility ladder and performing beam step-overs. Pt exhibits increased amplitude of stepping following practice of this task with good carryover to gait. Pt continues to benefit from skilled therapy services to address ongoing gait impairments leading to increased fall risk and decreased safety and independence with functional mobility. Continue POC.    OBJECTIVE IMPAIRMENTS: Abnormal gait, decreased balance, decreased cognition, decreased endurance, decreased knowledge of condition, and decreased safety awareness.   ACTIVITY LIMITATIONS: stairs and transfers  PARTICIPATION LIMITATIONS: driving, shopping, and community activity  PERSONAL FACTORS: Age and 1-2 comorbidities:    glaucoma, HTN, HLD, and Alzheimer's are also affecting patient's functional outcome.   REHAB POTENTIAL: Good  CLINICAL DECISION MAKING: Stable/uncomplicated  EVALUATION COMPLEXITY: Low  PLAN:  PT FREQUENCY: 2x/week  PT DURATION: 8 weeks (13 visits total, to allow for scheduling conflicts)  PLANNED INTERVENTIONS: Therapeutic exercises, Therapeutic activity, Neuromuscular re-education, Balance training, Gait training, Patient/Family education, Self Care, Joint mobilization, Joint manipulation, Stair training, Vestibular training, Canalith repositioning, Visual/preceptual remediation/compensation, Orthotic/Fit training, DME instructions, Aquatic Therapy, Dry Needling, Cognitive remediation, Electrical stimulation, Spinal manipulation, Spinal mobilization, Cryotherapy, Moist heat, Taping, Manual therapy, and Re-evaluation  PLAN FOR NEXT SESSION: add to HEP for standing  balance based on FGA impairments (backwards gait, tandem, gait with head turns, narrow BOS), initial sit to stand, increasing B step length (hurdles, agility ladder), work towards setting up exercise program he can do at [facility], foot clearance, ankle and stepping strategy as pt demonstrates none during LOB w/ static balance.   Peter Congo, PT, DPT, CSRS 08/29/2022, 10:59 AM

## 2022-09-01 ENCOUNTER — Ambulatory Visit: Payer: PPO | Admitting: Physical Therapy

## 2022-09-01 DIAGNOSIS — M6281 Muscle weakness (generalized): Secondary | ICD-10-CM | POA: Diagnosis not present

## 2022-09-01 DIAGNOSIS — R2681 Unsteadiness on feet: Secondary | ICD-10-CM

## 2022-09-01 DIAGNOSIS — R2689 Other abnormalities of gait and mobility: Secondary | ICD-10-CM

## 2022-09-01 NOTE — Therapy (Signed)
OUTPATIENT PHYSICAL THERAPY NEURO TREATMENT   Patient Name: Jason MAHANNAH Sr. MRN: [id number] DOB:02-21-1931, 88 y.o., male Today's Date: 08/29/2022   PCP: Vincente Liberty, MD REFERRING PROVIDER: Genia Harold, MD    PT End of Session - 08/29/22 1018     Visit Number 3    Number of Visits 13   with eval   Date for PT Re-Evaluation 10/13/22    Authorization Type [employer]    Progress Note Due on Visit 10    PT Start Time 1015    PT Stop Time 1057    PT Time Calculation (min) 42 min    Equipment Utilized During Treatment Gait belt    Activity Tolerance Patient tolerated treatment well    Behavior During Therapy WFL for tasks assessed/performed;Flat affect              Past Medical History:  Diagnosis Date   Arthritis    hp bone,knees,hands,shoulders   BPH (benign prostatic hyperplasia)    Diverticulosis    GERD (gastroesophageal reflux disease)    Glaucoma    Hyperlipemia    Hypertension    Loss of appetite    MCI (mild cognitive impairment)    Memory impairment    Sleep apnea    Past Surgical History:  Procedure Laterality Date   BUNIONECTOMY Left 05/16/2016   CATARACT EXTRACTION Bilateral 02/2013   TRANSURETHRAL RESECTION OF PROSTATE  1997   Patient Active Problem List   Diagnosis Date Noted   GI bleeding 03/23/2015   Diverticulosis 03/23/2015   Acute blood loss anemia 03/23/2015   Hypertension    GERD (gastroesophageal reflux disease)     ONSET DATE: 08/05/2022   REFERRING DIAG: R26.89 (ICD-10-CM) - Imbalance   THERAPY DIAG:  Muscle weakness (generalized)  Unsteadiness on feet  Other abnormalities of gait and mobility  Rationale for Evaluation and Treatment Rehabilitation  SUBJECTIVE:                                                                                                                                                                                             SUBJECTIVE STATEMENT: Pt reports no acute changes  since last session, no pain today. Pt accompanied by: family member son [name]  PERTINENT HISTORY: glaucoma, HTN, HLD, Alzheimer's   PAIN:  Are you having pain? No  PRECAUTIONS: Fall  WEIGHT BEARING RESTRICTIONS: No  FALLS: Has patient fallen in last 6 months? Yes. Number of falls 1: fell walking on driveway (fell forwards, got scraped up but no broken bones)  LIVING ENVIRONMENT: Lives with: lives with their family (other son) Lives in: House/apartment  Stairs: Yes: External: 4-5 steps; none Has following equipment at home: None  PLOF: Independent with gait and Independent with transfers; doesn't stay at home alone for longer than 1-2 hours (pt's family has cameras installed at home)  PATIENT GOALS: strengthen legs, pick up feet better with walking  OBJECTIVE:   TODAY'S TREATMENT:   09/01/22: THER EX: SciFit  level 4 for 8 minutes using BUE/BLEs for neural priming for reciprocal movement, dynamic cardiovascular warmup and increased amplitude of stepping.  Cues to increase LE ROM and speed increased from 20 - 50 steps/min Bridging x10, hooklying hip flexion with 2# wts x10, SLR x10 each    NMR:  Alt toe taps on 4" block, progressing with upright gaze and bil. UE flexion holding ball; continued with upper trunk rotations; Min guard to Min A level.   PATIENT EDUCATION: Education details: continue HEP, continue to work on increasing step length during gait Person educated: Patient Education method: Customer service manager Education comprehension: verbalized understanding and needs further education  HOME EXERCISE PROGRAM: Access Code: [facility] URL: [email or web address] Date: 08/27/2022 Prepared by: Elease Etienne  Exercises - Walking with Head Rotation  - 1 x daily - 4-5 x weekly - 3 sets - 10 reps - Tandem Walking with Counter Support  - 1 x daily - 4-5 x weekly - 3 sets - 10 reps - Backward Walking with Counter Support  - 1 x daily  - 4-5 x weekly - 3 sets - 10 reps   GOALS: Goals reviewed with patient? Yes  SHORT TERM GOALS: Target date: 09/08/2022  Pt will be independent with initial HEP for improved strength, balance, transfers and gait. Baseline: Goal status: INITIAL  2.  Pt will improve gait velocity to at least 1.95 ft/sec for improved gait efficiency and performance at mod I level  Baseline: 1.87 ft/sec Goal status: INITIAL  3.  Pt will improve normal TUG to less than or equal to 18 seconds for improved functional mobility and decreased fall risk. Baseline: 21.15 sec no AD Goal status: INITIAL  4.  Pt will improve FGA to 10/30 for decreased fall risk  Baseline: 7/30 Goal status: INITIAL   LONG TERM GOALS: Target date: 09/29/2022  Pt will be independent with final HEP for improved strength, balance, transfers and gait. Baseline:  Goal status: INITIAL  2.  Pt will improve gait velocity to at least 2.0 ft/sec for improved gait efficiency and performance at mod I level  Baseline: 1.87 ft/sec Goal status: INITIAL  3.  Pt will improve normal TUG to less than or equal to 15 seconds for improved functional mobility and decreased fall risk. Baseline: 21.15 sec no AD Goal status: INITIAL  4.  Pt will improve 5 x STS to less than or equal to 20 seconds to demonstrate improved functional strength and transfer efficiency.  Baseline: 24.31 Goal status: INITIAL  5.  Pt will improve FGA to 15/30 for decreased fall risk  Baseline: 7/30 Goal status: INITIAL   ASSESSMENT:  CLINICAL IMPRESSION:   Pt tolerated LE strength training well; noted that pt is challenged with bridging  and SLR and would benefit with continued strengthening of quads and gluts. Pt continues to need intermittent MinA with standing balance with narrow BOS.  Continue POC.      OBJECTIVE IMPAIRMENTS: Abnormal gait, decreased balance, decreased cognition, decreased endurance, decreased knowledge of condition, and decreased safety  awareness.   ACTIVITY LIMITATIONS: stairs and transfers  PARTICIPATION LIMITATIONS: driving, shopping, and community activity  PERSONAL FACTORS:  Age and 1-2 comorbidities:    glaucoma, HTN, HLD, and Alzheimer's are also affecting patient's functional outcome.   REHAB POTENTIAL: Good  CLINICAL DECISION MAKING: Stable/uncomplicated  EVALUATION COMPLEXITY: Low  PLAN:  PT FREQUENCY: 2x/week  PT DURATION: 8 weeks (13 visits total, to allow for scheduling conflicts)  PLANNED INTERVENTIONS: Therapeutic exercises, Therapeutic activity, Neuromuscular re-education, Balance training, Gait training, Patient/Family education, Self Care, Joint mobilization, Joint manipulation, Stair training, Vestibular training, Canalith repositioning, Visual/preceptual remediation/compensation, Orthotic/Fit training, DME instructions, Aquatic Therapy, Dry Needling, Cognitive remediation, Electrical stimulation, Spinal manipulation, Spinal mobilization, Cryotherapy, Moist heat, Taping, Manual therapy, and Re-evaluation  PLAN FOR NEXT SESSION: LE strengthening, initial sit to stand, increasing B step length (hurdles, agility ladder), work towards setting up exercise program he can do at [facility], foot clearance, ankle and stepping strategy as pt demonstrates none during LOB w/ static balance.   Bjorn Loser, PTA  09/01/22, 12:28 PM

## 2022-09-03 ENCOUNTER — Ambulatory Visit: Payer: PPO | Admitting: Physical Therapy

## 2022-09-03 DIAGNOSIS — M6281 Muscle weakness (generalized): Secondary | ICD-10-CM

## 2022-09-03 DIAGNOSIS — R2681 Unsteadiness on feet: Secondary | ICD-10-CM

## 2022-09-03 DIAGNOSIS — R2689 Other abnormalities of gait and mobility: Secondary | ICD-10-CM

## 2022-09-03 NOTE — Therapy (Signed)
OUTPATIENT PHYSICAL THERAPY NEURO TREATMENT   Patient Name: Jason DEMETRIOU Sr. MRN: [id number] DOB:09-Sep-1931, 88 y.o., male Today's Date: 09/03/2022   PCP: Corine Shelter, MD REFERRING PROVIDER: Ocie Doyne, MD    PT End of Session - 09/03/22 0934     Visit Number 5    Number of Visits 13    Date for PT Re-Evaluation 10/13/22    Authorization Type [employer]    Progress Note Due on Visit 10    PT Start Time 0933    PT Stop Time 1015    PT Time Calculation (min) 42 min    Equipment Utilized During Treatment Gait belt    Activity Tolerance Patient tolerated treatment well    Behavior During Therapy WFL for tasks assessed/performed;Flat affect               Past Medical History:  Diagnosis Date   Arthritis    hp bone,knees,hands,shoulders   BPH (benign prostatic hyperplasia)    Diverticulosis    GERD (gastroesophageal reflux disease)    Glaucoma    Hyperlipemia    Hypertension    Loss of appetite    MCI (mild cognitive impairment)    Memory impairment    Sleep apnea    Past Surgical History:  Procedure Laterality Date   BUNIONECTOMY Left 05/16/2016   CATARACT EXTRACTION Bilateral 02/2013   TRANSURETHRAL RESECTION OF PROSTATE  1997   Patient Active Problem List   Diagnosis Date Noted   GI bleeding 03/23/2015   Diverticulosis 03/23/2015   Acute blood loss anemia 03/23/2015   Hypertension    GERD (gastroesophageal reflux disease)     ONSET DATE: 08/05/2022   REFERRING DIAG: R26.89 (ICD-10-CM) - Imbalance   THERAPY DIAG:  Muscle weakness (generalized)  Unsteadiness on feet  Other abnormalities of gait and mobility  Rationale for Evaluation and Treatment Rehabilitation  SUBJECTIVE:                                                                                                                                                                                             SUBJECTIVE STATEMENT: Pt reports no acute changes since last  session, no falls. No pain today. Pt reports he has been doing his HEP, no questions over it and reports it is going "fine".  Pt accompanied by: family member son Jason Castaneda (in lobby)  PERTINENT HISTORY: glaucoma, HTN, HLD, Alzheimer's   PAIN:  Are you having pain? No  PRECAUTIONS: Fall  WEIGHT BEARING RESTRICTIONS: No  FALLS: Has patient fallen in last 6 months? Yes. Number of falls 1: fell walking on driveway (fell forwards, got scraped  up but no broken bones)  LIVING ENVIRONMENT: Lives with: lives with their family Uzziel Russey.) Lives in: House/apartment Stairs: Yes: External: 4-5 steps; none Has following equipment at home: None  PLOF: Independent with gait and Independent with transfers; doesn't stay at home alone for longer than 1-2 hours (pt's family has cameras installed at home)  PATIENT GOALS: strengthen legs, pick up feet better with walking  OBJECTIVE:   TODAY'S TREATMENT:   09/01/22: THER EX: Mini-squats x 10 reps with SBA with good form noted Added to HEP, see bolded below Mini-squats x 10 reps on airex with CGA with min cues needed to keep weight over heels Alt L/R step-taps 4" step with progression to 6" step with no UE support and CGA Progression to alt L/R 6" step-ups with one UE support on countertop and CGA for balance (poor eccentric control when stepping back down)  THER ACT: Sit to stand x 10 reps with no UE support ("easy") Sit to stand x 10 reps no UE support onto airex with CGA; some bracing of LE against mat table with standing and poor eccentric control when sitting Sit to stand x 10 reps onto airex while holding unweighted ball with CGA Sit to stand x 10 reps onto airex while holding 1 kg weighted ball with punch-outs upon standing, CGA  Tried stepping over 4" hurdles, pt circumducts BLE and knocks hurdles over Transitioned to stepping over poles on floor Pt exhibits improved step length and clearance as task progresses; initially exhibits ongoing  circumduction Attempted to have pt carry-over increased step length and clearance to gait and is able to do it initially but then reverts to shuffling of feet  PATIENT EDUCATION: Education details: added to HEP, continue to work on increasing step length during gait Person educated: Patient, son Jason Castaneda (provided handout in lobby at end of session) Education method: Explanation, Demonstration, and Handouts Education comprehension: verbalized understanding and needs further education  HOME EXERCISE PROGRAM: Access Code: [facility] URL: [email or web address] Date: 08/27/2022 Prepared by: Camille Bal  Exercises - Walking with Head Rotation  - 1 x daily - 4-5 x weekly - 3 sets - 10 reps - Tandem Walking with Counter Support  - 1 x daily - 4-5 x weekly - 3 sets - 10 reps - Backward Walking with Counter Support  - 1 x daily - 4-5 x weekly - 3 sets - 10 reps - Mini Squat with Chair  - 1 x daily - 7 x weekly - 3 sets - 10 reps   GOALS: Goals reviewed with patient? Yes  SHORT TERM GOALS: Target date: 09/08/2022  Pt will be independent with initial HEP for improved strength, balance, transfers and gait. Baseline: Goal status: INITIAL  2.  Pt will improve gait velocity to at least 1.95 ft/sec for improved gait efficiency and performance at mod I level  Baseline: 1.87 ft/sec Goal status: INITIAL  3.  Pt will improve normal TUG to less than or equal to 18 seconds for improved functional mobility and decreased fall risk. Baseline: 21.15 sec no AD Goal status: INITIAL  4.  Pt will improve FGA to 10/30 for decreased fall risk  Baseline: 7/30 Goal status: INITIAL   LONG TERM GOALS: Target date: 09/29/2022  Pt will be independent with final HEP for improved strength, balance, transfers and gait. Baseline:  Goal status: INITIAL  2.  Pt will improve gait velocity to at least 2.0 ft/sec for improved gait efficiency and performance at mod I level  Baseline: 1.87  ft/sec Goal status: INITIAL  3.  Pt will improve normal TUG to less than or equal to 15 seconds for improved functional mobility and decreased fall risk. Baseline: 21.15 sec no AD Goal status: INITIAL  4.  Pt will improve 5 x STS to less than or equal to 20 seconds to demonstrate improved functional strength and transfer efficiency.  Baseline: 24.31 Goal status: INITIAL  5.  Pt will improve FGA to 15/30 for decreased fall risk  Baseline: 7/30 Goal status: INITIAL   ASSESSMENT:  CLINICAL IMPRESSION: Emphasis of skilled PT session on continuing to work on balance with initial sit to stand, LE strengthening, and improving step length and LE clearance during gait. Pt exhibits improved balance with initial sit to stand this date even with added challenges of compliant surfaces and holding weighted ball. Pt exhibits good form while performing squats and can benefit from this exercise being added to HEP for LE strengthening. Pt does continue to exhibit hip flexor and hamstring weakness leading to impaired gait mechanics and ongoing circumduction when navigating hurdles/obstacles and shuffling gait pattern. Even with practice of increasing step length and LE clearance pt exhibits poor carryover to gait. Pt continues to benefit from skilled therapy services to address decreased LE strength, decreased balance, and impaired gait leading to decreased safety and independence with functional mobility. Continue POC.     OBJECTIVE IMPAIRMENTS: Abnormal gait, decreased balance, decreased cognition, decreased endurance, decreased knowledge of condition, and decreased safety awareness.   ACTIVITY LIMITATIONS: stairs and transfers  PARTICIPATION LIMITATIONS: driving, shopping, and community activity  PERSONAL FACTORS: Age and 1-2 comorbidities:    glaucoma, HTN, HLD, and Alzheimer's are also affecting patient's functional outcome.   REHAB POTENTIAL: Good  CLINICAL DECISION MAKING:  Stable/uncomplicated  EVALUATION COMPLEXITY: Low  PLAN:  PT FREQUENCY: 2x/week  PT DURATION: 8 weeks (13 visits total, to allow for scheduling conflicts)  PLANNED INTERVENTIONS: Therapeutic exercises, Therapeutic activity, Neuromuscular re-education, Balance training, Gait training, Patient/Family education, Self Care, Joint mobilization, Joint manipulation, Stair training, Vestibular training, Canalith repositioning, Visual/preceptual remediation/compensation, Orthotic/Fit training, DME instructions, Aquatic Therapy, Dry Needling, Cognitive remediation, Electrical stimulation, Spinal manipulation, Spinal mobilization, Cryotherapy, Moist heat, Taping, Manual therapy, and Re-evaluation  PLAN FOR NEXT SESSION: LE strengthening, increasing B step length (hurdles, agility ladder), work towards setting up exercise program he can do at [facility], foot clearance, ankle and stepping strategy as pt demonstrates none during LOB w/ static balance, step ups and step downs (eccentric control), leg press, assess STG   Peter Congo, PT, DPT, CSRS  09/03/22, 10:17 AM

## 2022-09-08 ENCOUNTER — Ambulatory Visit: Payer: PPO | Admitting: Physical Therapy

## 2022-09-08 DIAGNOSIS — M6281 Muscle weakness (generalized): Secondary | ICD-10-CM

## 2022-09-08 DIAGNOSIS — R2689 Other abnormalities of gait and mobility: Secondary | ICD-10-CM

## 2022-09-08 DIAGNOSIS — R2681 Unsteadiness on feet: Secondary | ICD-10-CM

## 2022-09-08 NOTE — Therapy (Signed)
OUTPATIENT PHYSICAL THERAPY NEURO TREATMENT   Patient Name: Jason ALPERIN Sr. MRN: [id number] DOB:[birth year], 88 y.o., male Today's Date: 09/08/2022   PCP: Vincente Liberty, MD REFERRING PROVIDER: Genia Harold, MD    PT End of Session - 09/08/22 1017     Visit Number 6    Number of Visits 13    Date for PT Re-Evaluation 10/13/22    Authorization Type [employer]    Progress Note Due on Visit 10    PT Start Time 1015    PT Stop Time 1100    PT Time Calculation (min) 45 min    Equipment Utilized During Treatment Gait belt    Activity Tolerance Patient tolerated treatment well    Behavior During Therapy WFL for tasks assessed/performed;Flat affect                Past Medical History:  Diagnosis Date   Arthritis    hp bone,knees,hands,shoulders   BPH (benign prostatic hyperplasia)    Diverticulosis    GERD (gastroesophageal reflux disease)    Glaucoma    Hyperlipemia    Hypertension    Loss of appetite    MCI (mild cognitive impairment)    Memory impairment    Sleep apnea    Past Surgical History:  Procedure Laterality Date   BUNIONECTOMY Left 05/16/2016   CATARACT EXTRACTION Bilateral 02/2013   TRANSURETHRAL RESECTION OF PROSTATE  1997   Patient Active Problem List   Diagnosis Date Noted   GI bleeding 03/23/2015   Diverticulosis 03/23/2015   Acute blood loss anemia 03/23/2015   Hypertension    GERD (gastroesophageal reflux disease)     ONSET DATE: 08/05/2022   REFERRING DIAG: R26.89 (ICD-10-CM) - Imbalance   THERAPY DIAG:  Muscle weakness (generalized)  Unsteadiness on feet  Other abnormalities of gait and mobility  Rationale for Evaluation and Treatment Rehabilitation  SUBJECTIVE:                                                                                                                                                                                             SUBJECTIVE STATEMENT: Pt reports he is doing fine today,  no changes since last session. No falls and no pain. Pt reports his HEP is going well with addition of squats last session.  Pt accompanied by: family member son Jason Castaneda (in lobby)  PERTINENT HISTORY: glaucoma, HTN, HLD, Alzheimer's   PAIN:  Are you having pain? No  PRECAUTIONS: Fall  WEIGHT BEARING RESTRICTIONS: No  FALLS: Has patient fallen in last 6 months? Yes. Number of falls 1: fell walking on driveway (fell forwards, got scraped  up but no broken bones)  LIVING ENVIRONMENT: Lives with: lives with their family Jason Neis.) Lives in: House/apartment Stairs: Yes: External: 4-5 steps; none Has following equipment at home: None  PLOF: Independent with gait and Independent with transfers; doesn't stay at home alone for longer than 1-2 hours (pt's family has cameras installed at home)  PATIENT GOALS: strengthen legs, pick up feet better with walking  OBJECTIVE:   TODAY'S TREATMENT:    THER ACT: Reassessment of outcome measures for STG assessment:   [facility] PT Assessment - 09/08/22 1021       Ambulation/Gait   Gait velocity 32.8 ft over 15.97 sec = 2.05 ft/sec      Timed Up and Go Test   TUG Normal TUG    Normal TUG (seconds) 19.31      Functional Gait  Assessment   Gait assessed  Yes    Gait Level Surface Walks 20 ft, slow speed, abnormal gait pattern, evidence for imbalance or deviates 10-15 in outside of the 12 in walkway width. Requires more than 7 sec to ambulate 20 ft.    Change in Gait Speed Makes only minor adjustments to walking speed, or accomplishes a change in speed with significant gait deviations, deviates 10-15 in outside the 12 in walkway width, or changes speed but loses balance but is able to recover and continue walking.    Gait with Horizontal Head Turns Performs head turns smoothly with slight change in gait velocity (eg, minor disruption to smooth gait path), deviates 6-10 in outside 12 in walkway width, or uses an assistive device.    Gait with  Vertical Head Turns Performs task with slight change in gait velocity (eg, minor disruption to smooth gait path), deviates 6 - 10 in outside 12 in walkway width or uses assistive device    Gait and Pivot Turn Pivot turns safely within 3 sec and stops quickly with no loss of balance.    Step Over Obstacle Cannot perform without assistance.    Gait with Narrow Base of Support Ambulates less than 4 steps heel to toe or cannot perform without assistance.    Gait with Eyes Closed Walks 20 ft, slow speed, abnormal gait pattern, evidence for imbalance, deviates 10-15 in outside 12 in walkway width. Requires more than 9 sec to ambulate 20 ft.    Ambulating Backwards Cannot walk 20 ft without assistance, severe gait deviations or imbalance, deviates greater than 15 in outside 12 in walkway width or will not attempt task.    Steps Alternating feet, must use rail.    Total Score 12    FGA comment: 12/30, high fall risk            In // bars: 4" eccentric step-downs to target, 2 x 10 reps B 6" eccentric step-downs to target, 1 x 10 reps B with increased UE support needed Alt L/R foam beam step-overs to target Progression from BUE support to no UE support, occasional steadying against bars with UE Gait in // bars while stepping over foam beams 3 x 10 ft with good LE clearance noted   GAIT: Gait pattern:  decreased gait speed, decreased step length- Right, decreased step length- Left, decreased stride length, decreased hip/knee flexion- Right, decreased hip/knee flexion- Left, decreased ankle dorsiflexion- Right, decreased ankle dorsiflexion- Left, and shuffling Distance walked: 200 ft Assistive device utilized: None Level of assistance: Modified independence Comments: cues to increase step length and decrease shuffling of gait pattern; initially with good carryover with decrease  in carryover noted with increase in gait distance   PATIENT EDUCATION: Education details: continue HEP and continue to  work on increasing step length during gait Person educated: Patient Education method: Customer service manager Education comprehension: verbalized understanding and needs further education  HOME EXERCISE PROGRAM: Access Code: [facility] URL: [email or web address] Date: 08/27/2022 Prepared by: Elease Etienne  Exercises - Walking with Head Rotation  - 1 x daily - 4-5 x weekly - 3 sets - 10 reps - Tandem Walking with Counter Support  - 1 x daily - 4-5 x weekly - 3 sets - 10 reps - Backward Walking with Counter Support  - 1 x daily - 4-5 x weekly - 3 sets - 10 reps - Mini Squat with Chair  - 1 x daily - 7 x weekly - 3 sets - 10 reps   GOALS: Goals reviewed with patient? Yes  SHORT TERM GOALS: Target date: 09/08/2022  Pt will be independent with initial HEP for improved strength, balance, transfers and gait. Baseline: Goal status: MET  2.  Pt will improve gait velocity to at least 1.95 ft/sec for improved gait efficiency and performance at mod I level  Baseline: 1.87 ft/sec, 2.05 ft/sec (11/13) Goal status: MET  3.  Pt will improve normal TUG to less than or equal to 18 seconds for improved functional mobility and decreased fall risk. Baseline: 21.15 sec no AD, 19.31 sec no AD (11/13) Goal status: IN PROGRESS  4.  Pt will improve FGA to 10/30 for decreased fall risk  Baseline: 7/30, 12/30 (11/13) Goal status: MET   LONG TERM GOALS: Target date: 09/29/2022  Pt will be independent with final HEP for improved strength, balance, transfers and gait. Baseline:  Goal status: INITIAL  2.  Pt will improve gait velocity to at least 2.2 ft/sec for improved gait efficiency and performance at mod I level  Baseline: 1.87 ft/sec, 2.05 ft/sec (11/3) Goal status: REVISED  3.  Pt will improve normal TUG to less than or equal to 15 seconds for improved functional mobility and decreased fall risk. Baseline: 21.15 sec no AD, 19.31 sec no AD (11/13) Goal status:  INITIAL  4.  Pt will improve 5 x STS to less than or equal to 20 seconds to demonstrate improved functional strength and transfer efficiency.  Baseline: 24.31 Goal status: INITIAL  5.  Pt will improve FGA to 15/30 for decreased fall risk  Baseline: 7/30, 12/30 (11/13) Goal status: INITIAL   ASSESSMENT:  CLINICAL IMPRESSION: Emphasis of skilled PT session on reassessing STG, working on LE strengthening and control, and working on increasing step length with focus on attempting to carryover practice to gait in clinic. Pt has met 3/4 STG due to being independent with his initial HEP, improving his gait speed from 1.87 ft/sec initially to 2.05 ft/sec this date and improving his FGA score from 7/30 initially to 12/30 this date. He has also improved his TUG score from 21.15 sec initially to 19.31 sec this date but did not quite meet goal of 18 sec. Overall, pt has shown progress with therapy with improved balance and decreased fall risk. Pt continues to benefit from skilled therapy services due to ongoing shuffling gait pattern and decreased carryover of practice of increasing step length to actual gait. Continue POC.   OBJECTIVE IMPAIRMENTS: Abnormal gait, decreased balance, decreased cognition, decreased endurance, decreased knowledge of condition, and decreased safety awareness.   ACTIVITY LIMITATIONS: stairs and transfers  PARTICIPATION LIMITATIONS: driving, shopping, and community activity  PERSONAL FACTORS: Age  and 1-2 comorbidities:    glaucoma, HTN, HLD, and Alzheimer's are also affecting patient's functional outcome.   REHAB POTENTIAL: Good  CLINICAL DECISION MAKING: Stable/uncomplicated  EVALUATION COMPLEXITY: Low  PLAN:  PT FREQUENCY: 2x/week  PT DURATION: 8 weeks (13 visits total, to allow for scheduling conflicts)  PLANNED INTERVENTIONS: Therapeutic exercises, Therapeutic activity, Neuromuscular re-education, Balance training, Gait training, Patient/Family education, Self  Care, Joint mobilization, Joint manipulation, Stair training, Vestibular training, Canalith repositioning, Visual/preceptual remediation/compensation, Orthotic/Fit training, DME instructions, Aquatic Therapy, Dry Needling, Cognitive remediation, Electrical stimulation, Spinal manipulation, Spinal mobilization, Cryotherapy, Moist heat, Taping, Manual therapy, and Re-evaluation  PLAN FOR NEXT SESSION: LE strengthening, increasing B step length (hurdles, agility ladder), work towards setting up exercise program he can do at [facility], foot clearance, ankle and stepping strategy as pt demonstrates none during LOB w/ static balance, leg press   Excell Seltzer, PT, DPT, CSRS  09/08/22, 11:02 AM

## 2022-09-10 ENCOUNTER — Encounter: Payer: Self-pay | Admitting: Physical Therapy

## 2022-09-10 ENCOUNTER — Ambulatory Visit: Payer: PPO | Admitting: Physical Therapy

## 2022-09-10 DIAGNOSIS — R2689 Other abnormalities of gait and mobility: Secondary | ICD-10-CM

## 2022-09-10 DIAGNOSIS — M6281 Muscle weakness (generalized): Secondary | ICD-10-CM

## 2022-09-10 DIAGNOSIS — R2681 Unsteadiness on feet: Secondary | ICD-10-CM

## 2022-09-10 NOTE — Therapy (Signed)
OUTPATIENT PHYSICAL THERAPY NEURO TREATMENT   Patient Name: Jason CRIADO Sr. MRN: [id number] DOB:03-Jun-1931, 88 y.o., male Today's Date: 09/10/2022   PCP: Vincente Liberty, MD REFERRING PROVIDER: Genia Harold, MD    PT End of Session - 09/10/22 1107     Visit Number 7    Number of Visits 13    Date for PT Re-Evaluation 10/13/22    Authorization Type [employer]    Progress Note Due on Visit 10    PT Start Time 1104    PT Stop Time 1146    PT Time Calculation (min) 42 min    Equipment Utilized During Treatment Gait belt    Activity Tolerance Patient tolerated treatment well    Behavior During Therapy WFL for tasks assessed/performed;Flat affect                Past Medical History:  Diagnosis Date   Arthritis    hp bone,knees,hands,shoulders   BPH (benign prostatic hyperplasia)    Diverticulosis    GERD (gastroesophageal reflux disease)    Glaucoma    Hyperlipemia    Hypertension    Loss of appetite    MCI (mild cognitive impairment)    Memory impairment    Sleep apnea    Past Surgical History:  Procedure Laterality Date   BUNIONECTOMY Left 05/16/2016   CATARACT EXTRACTION Bilateral 02/2013   TRANSURETHRAL RESECTION OF PROSTATE  1997   Patient Active Problem List   Diagnosis Date Noted   GI bleeding 03/23/2015   Diverticulosis 03/23/2015   Acute blood loss anemia 03/23/2015   Hypertension    GERD (gastroesophageal reflux disease)     ONSET DATE: 08/05/2022   REFERRING DIAG: R26.89 (ICD-10-CM) - Imbalance   THERAPY DIAG:  Muscle weakness (generalized)  Unsteadiness on feet  Other abnormalities of gait and mobility  Rationale for Evaluation and Treatment Rehabilitation  SUBJECTIVE:                                                                                                                                                                                             SUBJECTIVE STATEMENT: He denies fall or other acute  changes.  He states his HEP remains fine.  Pt accompanied by: family member son Jason Castaneda (in lobby)  PERTINENT HISTORY: glaucoma, HTN, HLD, Alzheimer's   PAIN:  Are you having pain? No  PRECAUTIONS: Fall  WEIGHT BEARING RESTRICTIONS: No  FALLS: Has patient fallen in last 6 months? Yes. Number of falls 1: fell walking on driveway (fell forwards, got scraped up but no broken bones)  LIVING ENVIRONMENT: Lives with: lives with their family Jason Castaneda.)  Lives in: House/apartment Stairs: Yes: External: 4-5 steps; none Has following equipment at home: None  PLOF: Independent with gait and Independent with transfers; doesn't stay at home alone for longer than 1-2 hours (pt's family has cameras installed at home)  PATIENT GOALS: strengthen legs, pick up feet better with walking  OBJECTIVE:   TODAY'S TREATMENT:   -lateral stepping w/ ball toss to wall, moderate multimodal cuing and inc time for pt to engage to task sequencing and good form to prevent quad compensation -lateral step over 2"-4" hurdles w/ BUE support, cuing for glut med engagement and step size to improve motor planning in relation to obstacle clearance -Dot drills for inc step size, stepping strategy, and motor planning, pt unable to progress safely to dual task component w/ multiple order colors called, able to coordinate LE called w/ color called -Pt ambulates 230' SBA cued for environmental scanning laterally and up and down with pt naming recognizable objects, pt has one instance of anterior LOB w/ multiple small steps to recover that occurs when pt is facing forward and not naming objects, cued to prevent shuffling gait throughout w/ pt unable to maintain when not prompted. -STS w/ thera-band yellow ball toss to second person x8 > 1kg ball for smaller target w/ increased weight x15 -NuStep x4 mins level 2.0 w/ BLE only, pt achieve 23 avg steps/min, performed for neural priming for reciprocal movement.  PATIENT  EDUCATION: Education details: Continue HEP and walking with family picking up feet. Person educated: Patient Education method: Customer service manager Education comprehension: verbalized understanding and needs further education  HOME EXERCISE PROGRAM: Access Code: [email or web address] URL: [email or web address] Date: 08/27/2022 Prepared by: Elease Etienne  Exercises - Walking with Head Rotation  - 1 x daily - 4-5 x weekly - 3 sets - 10 reps - Tandem Walking with Counter Support  - 1 x daily - 4-5 x weekly - 3 sets - 10 reps - Backward Walking with Counter Support  - 1 x daily - 4-5 x weekly - 3 sets - 10 reps - Mini Squat with Chair  - 1 x daily - 7 x weekly - 3 sets - 10 reps  GOALS: Goals reviewed with patient? Yes  SHORT TERM GOALS: Target date: 09/08/2022  Pt will be independent with initial HEP for improved strength, balance, transfers and gait. Baseline: Goal status: MET  2.  Pt will improve gait velocity to at least 1.95 ft/sec for improved gait efficiency and performance at mod I level  Baseline: 1.87 ft/sec, 2.05 ft/sec (11/13) Goal status: MET  3.  Pt will improve normal TUG to less than or equal to 18 seconds for improved functional mobility and decreased fall risk. Baseline: 21.15 sec no AD, 19.31 sec no AD (11/13) Goal status: IN PROGRESS  4.  Pt will improve FGA to 10/30 for decreased fall risk  Baseline: 7/30, 12/30 (11/13) Goal status: MET   LONG TERM GOALS: Target date: 09/29/2022  Pt will be independent with final HEP for improved strength, balance, transfers and gait. Baseline:  Goal status: INITIAL  2.  Pt will improve gait velocity to at least 2.2 ft/sec for improved gait efficiency and performance at mod I level  Baseline: 1.87 ft/sec, 2.05 ft/sec (11/3) Goal status: REVISED  3.  Pt will improve normal TUG to less than or equal to 15 seconds for improved functional mobility and decreased fall risk. Baseline: 21.15 sec no AD, 19.31  sec no AD (11/13) Goal status: INITIAL  4.  Pt will  improve 5 x STS to less than or equal to 20 seconds to demonstrate improved functional strength and transfer efficiency.  Baseline: 24.31 Goal status: INITIAL  5.  Pt will improve FGA to 15/30 for decreased fall risk  Baseline: 7/30, 12/30 (11/13) Goal status: INITIAL   ASSESSMENT:  CLINICAL IMPRESSION: Focus of skilled session today on having pt perform functional strengthening tasks with attempts to progress to dual tasking component.  Pt continues to be distractible, demonstrating decreased carryover of education from task to task, and requires repetition and demonstration w/ one step cuing to best complete compound movements.  He continues to benefit from skilled PT to further encourage safety with all upright mobility.  OBJECTIVE IMPAIRMENTS: Abnormal gait, decreased balance, decreased cognition, decreased endurance, decreased knowledge of condition, and decreased safety awareness.   ACTIVITY LIMITATIONS: stairs and transfers  PARTICIPATION LIMITATIONS: driving, shopping, and community activity  PERSONAL FACTORS: Age and 1-2 comorbidities:    glaucoma, HTN, HLD, and Alzheimer's are also affecting patient's functional outcome.   REHAB POTENTIAL: Good  CLINICAL DECISION MAKING: Stable/uncomplicated  EVALUATION COMPLEXITY: Low  PLAN:  PT FREQUENCY: 2x/week  PT DURATION: 8 weeks (13 visits total, to allow for scheduling conflicts)  PLANNED INTERVENTIONS: Therapeutic exercises, Therapeutic activity, Neuromuscular re-education, Balance training, Gait training, Patient/Family education, Self Care, Joint mobilization, Joint manipulation, Stair training, Vestibular training, Canalith repositioning, Visual/preceptual remediation/compensation, Orthotic/Fit training, DME instructions, Aquatic Therapy, Dry Needling, Cognitive remediation, Electrical stimulation, Spinal manipulation, Spinal mobilization, Cryotherapy, Moist heat, Taping,  Manual therapy, and Re-evaluation  PLAN FOR NEXT SESSION: LE strengthening, increasing B step length (hurdles, agility ladder), work towards setting up exercise program he can do at [facility], foot clearance, ankle and stepping strategy as pt demonstrates none during LOB w/ static balance, leg press   Elease Etienne, PT, DPT 09/10/22, 12:30 PM

## 2022-09-15 ENCOUNTER — Ambulatory Visit: Payer: PPO | Admitting: Physical Therapy

## 2022-09-15 DIAGNOSIS — M6281 Muscle weakness (generalized): Secondary | ICD-10-CM

## 2022-09-15 DIAGNOSIS — R2689 Other abnormalities of gait and mobility: Secondary | ICD-10-CM

## 2022-09-15 DIAGNOSIS — R2681 Unsteadiness on feet: Secondary | ICD-10-CM

## 2022-09-15 NOTE — Therapy (Addendum)
OUTPATIENT PHYSICAL THERAPY NEURO TREATMENT   Patient Name: Jason YELLIN Sr. MRN: [id number] DOB:12-16-1930, 88 y.o., male Today's Date: 09/15/2022    PCP: Vincente Liberty, MD REFERRING PROVIDER: Genia Harold, MD    PT End of Session - 09/15/22 1022     Visit Number 8    Number of Visits 13    Date for PT Re-Evaluation 10/13/22    Authorization Type [employer]    Progress Note Due on Visit 10    PT Start Time 1015    PT Stop Time 1055    PT Time Calculation (min) 40 min    Equipment Utilized During Treatment Gait belt    Activity Tolerance Patient tolerated treatment well    Behavior During Therapy WFL for tasks assessed/performed;Flat affect                Past Medical History:  Diagnosis Date   Arthritis    hp bone,knees,hands,shoulders   BPH (benign prostatic hyperplasia)    Diverticulosis    GERD (gastroesophageal reflux disease)    Glaucoma    Hyperlipemia    Hypertension    Loss of appetite    MCI (mild cognitive impairment)    Memory impairment    Sleep apnea    Past Surgical History:  Procedure Laterality Date   BUNIONECTOMY Left 05/16/2016   CATARACT EXTRACTION Bilateral 02/2013   TRANSURETHRAL RESECTION OF PROSTATE  1997   Patient Active Problem List   Diagnosis Date Noted   GI bleeding 03/23/2015   Diverticulosis 03/23/2015   Acute blood loss anemia 03/23/2015   Hypertension    GERD (gastroesophageal reflux disease)     ONSET DATE: 08/05/2022   REFERRING DIAG: R26.89 (ICD-10-CM) - Imbalance   THERAPY DIAG:  Muscle weakness (generalized)  Unsteadiness on feet  Other abnormalities of gait and mobility  Rationale for Evaluation and Treatment Rehabilitation  SUBJECTIVE:                                                                                                                                                                                             SUBJECTIVE STATEMENT: He denies fall or other acute  changes.   Pt accompanied by: family member son [name]  PERTINENT HISTORY: glaucoma, HTN, HLD, Alzheimer's   PAIN:  Are you having pain? No  PRECAUTIONS: Fall  WEIGHT BEARING RESTRICTIONS: No  FALLS: Has patient fallen in last 6 months? Yes. Number of falls 1: fell walking on driveway (fell forwards, got scraped up but no broken bones)  LIVING ENVIRONMENT: Lives with: lives with their family Tavoris Brisk.) Lives in: House/apartment Stairs: Yes:  External: 4-5 steps; none Has following equipment at home: None  PLOF: Independent with gait and Independent with transfers; doesn't stay at home alone for longer than 1-2 hours (pt's family has cameras installed at home)  PATIENT GOALS: strengthen legs, pick up feet better with walking  OBJECTIVE:   TODAY'S TREATMENT:    [facility] Adult PT Treatment/Exercise - 09/15/22 0001       Ambulation/Gait   Ambulation/Gait Yes    Ambulation/Gait Assistance 5: Supervision    Ambulation/Gait Assistance Details working on heel to gait pattern for foot clearance,    Ambulation Distance (Feet) 230 Feet    Assistive device None    Gait Pattern Step-through pattern;Decreased step length - right;Decreased step length - left;Right foot flat;Left foot flat;Trunk flexed    Ambulation Surface Level;Indoor    Stairs Yes    Stairs Assistance 5: Supervision;4: Min assist   Min A performed without UE support slight LOB posteriorly. multiple reps   Stairs Assistance Details (indicate cue type and reason) working on strength and balance progressing without UE supprt.    Stair Management Technique One rail Right;No rails    Number of Stairs 4   x4   Height of Stairs 6    Ramp 4: Min assist;5: Supervision   working on balance maintaing heel to gait pattern.   Ramp Details (indicate cue type and reason) Pt tended to slightly lose balance forward.    Curb 4: Min assist    Curb Details (indicate cue type and reason) x10 with each LE leading working on  working on strength and balance.      Knee/Hip Exercises: Aerobic   Stepper seated, all extremities, 8 min, level 5, 55 steps/min, cues for ROM, for strengthening and to build activity tolerance.          Balance Exercises - 09/15/22 0001       Balance Exercises: Standing   Sit to Stand Without upper extremity support;Other (comment)   holding weighted red ball. x10   Standing (level surface) and reaching for objects on floor then with upper trunk rotations stacking behind:  no LOB without external support.           PATIENT EDUCATION: Education details: Continue HEP and walking with family picking up feet. Person educated: Patient Education method: Customer service manager Education comprehension: verbalized understanding and needs further education  HOME EXERCISE PROGRAM: Access Code: [email or web address] URL: [email or web address] Date: 08/27/2022 Prepared by: Elease Etienne  Exercises - Walking with Head Rotation  - 1 x daily - 4-5 x weekly - 3 sets - 10 reps - Tandem Walking with Counter Support  - 1 x daily - 4-5 x weekly - 3 sets - 10 reps - Backward Walking with Counter Support  - 1 x daily - 4-5 x weekly - 3 sets - 10 reps - Mini Squat with Chair  - 1 x daily - 7 x weekly - 3 sets - 10 reps  GOALS: Goals reviewed with patient? Yes  SHORT TERM GOALS: Target date: 09/08/2022  Pt will be independent with initial HEP for improved strength, balance, transfers and gait. Baseline: Goal status: MET  2.  Pt will improve gait velocity to at least 1.95 ft/sec for improved gait efficiency and performance at mod I level  Baseline: 1.87 ft/sec, 2.05 ft/sec (11/13) Goal status: MET  3.  Pt will improve normal TUG to less than or equal to 18 seconds for improved functional mobility and decreased fall risk. Baseline: 21.15 sec no AD, 19.31  sec no AD (11/13) Goal status: IN PROGRESS  4.  Pt will improve FGA to 10/30 for decreased fall risk  Baseline: 7/30,  12/30 (11/13) Goal status: MET   LONG TERM GOALS: Target date: 09/29/2022  Pt will be independent with final HEP for improved strength, balance, transfers and gait. Baseline:  Goal status: INITIAL  2.  Pt will improve gait velocity to at least 2.2 ft/sec for improved gait efficiency and performance at mod I level  Baseline: 1.87 ft/sec, 2.05 ft/sec (11/3) Goal status: REVISED  3.  Pt will improve normal TUG to less than or equal to 15 seconds for improved functional mobility and decreased fall risk. Baseline: 21.15 sec no AD, 19.31 sec no AD (11/13) Goal status: INITIAL  4.  Pt will improve 5 x STS to less than or equal to 20 seconds to demonstrate improved functional strength and transfer efficiency.  Baseline: 24.31 Goal status: INITIAL  5.  Pt will improve FGA to 15/30 for decreased fall risk  Baseline: 7/30, 12/30 (11/13) Goal status: INITIAL   ASSESSMENT:  CLINICAL IMPRESSION: Progressing pt's treatment towards functional dynamic balance and gait.  Noted that pt had slight LOB posteriorly descending stairs without UE support and slight LOB anteriorly with ramp negotiation, both requiring intermittent MinA to maintain balance.  Pt is making progress towards LTGs.   OBJECTIVE IMPAIRMENTS: Abnormal gait, decreased balance, decreased cognition, decreased endurance, decreased knowledge of condition, and decreased safety awareness.   ACTIVITY LIMITATIONS: stairs and transfers  PARTICIPATION LIMITATIONS: driving, shopping, and community activity  PERSONAL FACTORS: Age and 1-2 comorbidities:    glaucoma, HTN, HLD, and Alzheimer's are also affecting patient's functional outcome.   REHAB POTENTIAL: Good  CLINICAL DECISION MAKING: Stable/uncomplicated  EVALUATION COMPLEXITY: Low  PLAN:  PT FREQUENCY: 2x/week  PT DURATION: 8 weeks (13 visits total, to allow for scheduling conflicts)  PLANNED INTERVENTIONS: Therapeutic exercises, Therapeutic activity, Neuromuscular  re-education, Balance training, Gait training, Patient/Family education, Self Care, Joint mobilization, Joint manipulation, Stair training, Vestibular training, Canalith repositioning, Visual/preceptual remediation/compensation, Orthotic/Fit training, DME instructions, Aquatic Therapy, Dry Needling, Cognitive remediation, Electrical stimulation, Spinal manipulation, Spinal mobilization, Cryotherapy, Moist heat, Taping, Manual therapy, and Re-evaluation  PLAN FOR NEXT SESSION: LE strengthening  (STEP downs for strength and balance 11/20), increasing B step length (hurdles, agility ladder), work towards setting up exercise program he can do at [facility], foot clearance, ankle and stepping strategy as pt demonstrates none during LOB w/ static balance, leg press   Bjorn Loser, PTA  09/15/22, 11:20 AM

## 2022-09-16 ENCOUNTER — Ambulatory Visit: Payer: PPO | Admitting: Physical Therapy

## 2022-09-22 ENCOUNTER — Ambulatory Visit: Payer: PPO | Admitting: Physical Therapy

## 2022-09-24 ENCOUNTER — Ambulatory Visit: Payer: PPO | Admitting: Physical Therapy

## 2022-09-29 ENCOUNTER — Ambulatory Visit: Payer: PPO | Attending: Psychiatry | Admitting: Physical Therapy

## 2022-09-29 ENCOUNTER — Telehealth: Payer: Self-pay | Admitting: Physical Therapy

## 2022-09-29 NOTE — Telephone Encounter (Signed)
Called pt phone number and left message regarding today's missed appointment. Hortencia Conradi, PTA  09/29/22, 10:52 AM

## 2022-10-01 ENCOUNTER — Ambulatory Visit: Payer: PPO | Admitting: Physical Therapy

## 2022-10-01 NOTE — Therapy (Incomplete)
OUTPATIENT PHYSICAL THERAPY NEURO TREATMENT   Patient Name: Jason Castaneda. MRN: [id number] DOB:[birth year], 88 y.o., male Today's Date: 10/01/2022    PCP: Vincente Liberty, MD REFERRING PROVIDER: Genia Harold, MD         Past Medical History:  Diagnosis Date   Arthritis    hp bone,knees,hands,shoulders   BPH (benign prostatic hyperplasia)    Diverticulosis    GERD (gastroesophageal reflux disease)    Glaucoma    Hyperlipemia    Hypertension    Loss of appetite    MCI (mild cognitive impairment)    Memory impairment    Sleep apnea    Past Surgical History:  Procedure Laterality Date   BUNIONECTOMY Left 05/16/2016   CATARACT EXTRACTION Bilateral 02/2013   TRANSURETHRAL RESECTION OF PROSTATE  1997   Patient Active Problem List   Diagnosis Date Noted   GI bleeding 03/23/2015   Diverticulosis 03/23/2015   Acute blood loss anemia 03/23/2015   Hypertension    GERD (gastroesophageal reflux disease)     ONSET DATE: 08/05/2022   REFERRING DIAG: R26.89 (ICD-10-CM) - Imbalance   THERAPY DIAG:  No diagnosis found.  Rationale for Evaluation and Treatment Rehabilitation  SUBJECTIVE:                                                                                                                                                                                             SUBJECTIVE STATEMENT: He denies fall or other acute changes.   Pt accompanied by: family member son [name]  PERTINENT HISTORY: glaucoma, HTN, HLD, Alzheimer's   PAIN:  Are you having pain? No  PRECAUTIONS: Fall  WEIGHT BEARING RESTRICTIONS: No  FALLS: Has patient fallen in last 6 months? Yes. Number of falls 1: fell walking on driveway (fell forwards, got scraped up but no broken bones)  LIVING ENVIRONMENT: Lives with: lives with their family Koran Seabrook.) Lives in: House/apartment Stairs: Yes: External: 4-5 steps; none Has following equipment at home: None  PLOF:  Independent with gait and Independent with transfers; doesn't stay at home alone for longer than 1-2 hours (pt's family has cameras installed at home)  PATIENT GOALS: strengthen legs, pick up feet better with walking  OBJECTIVE:   TODAY'S TREATMENT:   THER ACT:  THER EX:  GAIT:         PATIENT EDUCATION: Education details: Continue HEP and walking with family picking up feet.*** Person educated: Patient Education method: Customer service manager Education comprehension: verbalized understanding and needs further education  HOME EXERCISE PROGRAM: Access Code: [email or web address] URL: [email or web address] Date: 08/27/2022 Prepared by: Elease Etienne  Exercises - Walking  with Head Rotation  - 1 x daily - 4-5 x weekly - 3 sets - 10 reps - Tandem Walking with Counter Support  - 1 x daily - 4-5 x weekly - 3 sets - 10 reps - Backward Walking with Counter Support  - 1 x daily - 4-5 x weekly - 3 sets - 10 reps - Mini Squat with Chair  - 1 x daily - 7 x weekly - 3 sets - 10 reps  GOALS: Goals reviewed with patient? Yes  SHORT TERM GOALS: Target date: 09/08/2022  Pt will be independent with initial HEP for improved strength, balance, transfers and gait. Baseline: Goal status: MET  2.  Pt will improve gait velocity to at least 1.95 ft/sec for improved gait efficiency and performance at mod I level  Baseline: 1.87 ft/sec, 2.05 ft/sec (11/13) Goal status: MET  3.  Pt will improve normal TUG to less than or equal to 18 seconds for improved functional mobility and decreased fall risk. Baseline: 21.15 sec no AD, 19.31 sec no AD (11/13) Goal status: IN PROGRESS  4.  Pt will improve FGA to 10/30 for decreased fall risk  Baseline: 7/30, 12/30 (11/13) Goal status: MET   LONG TERM GOALS: Target date: 09/29/2022***  Pt will be independent with final HEP for improved strength, balance, transfers and gait. Baseline:  Goal status: INITIAL  2.  Pt will improve gait  velocity to at least 2.2 ft/sec for improved gait efficiency and performance at mod I level  Baseline: 1.87 ft/sec, 2.05 ft/sec (11/3) Goal status: REVISED  3.  Pt will improve normal TUG to less than or equal to 15 seconds for improved functional mobility and decreased fall risk. Baseline: 21.15 sec no AD, 19.31 sec no AD (11/13) Goal status: INITIAL  4.  Pt will improve 5 x STS to less than or equal to 20 seconds to demonstrate improved functional strength and transfer efficiency.  Baseline: 24.31 Goal status: INITIAL  5.  Pt will improve FGA to 15/30 for decreased fall risk  Baseline: 7/30, 12/30 (11/13) Goal status: INITIAL   ASSESSMENT:  CLINICAL IMPRESSION: Emphasis of skilled PT session*** Continue POC.    OBJECTIVE IMPAIRMENTS: Abnormal gait, decreased balance, decreased cognition, decreased endurance, decreased knowledge of condition, and decreased safety awareness.   ACTIVITY LIMITATIONS: stairs and transfers  PARTICIPATION LIMITATIONS: driving, shopping, and community activity  PERSONAL FACTORS: Age and 1-2 comorbidities:    glaucoma, HTN, HLD, and Alzheimer's are also affecting patient's functional outcome.   REHAB POTENTIAL: Good  CLINICAL DECISION MAKING: Stable/uncomplicated  EVALUATION COMPLEXITY: Low  PLAN:  PT FREQUENCY: 2x/week  PT DURATION: 8 weeks (13 visits total, to allow for scheduling conflicts)  PLANNED INTERVENTIONS: Therapeutic exercises, Therapeutic activity, Neuromuscular re-education, Balance training, Gait training, Patient/Family education, Self Care, Joint mobilization, Joint manipulation, Stair training, Vestibular training, Canalith repositioning, Visual/preceptual remediation/compensation, Orthotic/Fit training, DME instructions, Aquatic Therapy, Dry Needling, Cognitive remediation, Electrical stimulation, Spinal manipulation, Spinal mobilization, Cryotherapy, Moist heat, Taping, Manual therapy, and Re-evaluation  PLAN FOR NEXT  SESSION: LE strengthening  (STEP downs for strength and balance 11/20), increasing B step length (hurdles, agility ladder), work towards setting up exercise program he can do at [facility], foot clearance, ankle and stepping strategy as pt demonstrates none during LOB w/ static balance, leg press***   Excell Seltzer, PT, DPT, CSRS  10/01/22, 8:03 AM

## 2022-10-06 ENCOUNTER — Ambulatory Visit: Payer: PPO | Admitting: Physical Therapy

## 2022-12-03 ENCOUNTER — Encounter: Payer: Self-pay | Admitting: Family

## 2022-12-04 ENCOUNTER — Ambulatory Visit (INDEPENDENT_AMBULATORY_CARE_PROVIDER_SITE_OTHER): Payer: PPO | Admitting: Family

## 2022-12-04 ENCOUNTER — Ambulatory Visit: Payer: PPO | Admitting: Family

## 2022-12-04 ENCOUNTER — Encounter: Payer: Self-pay | Admitting: Family

## 2022-12-04 VITALS — BP 138/90 | HR 61 | Temp 98.2°F | Resp 18 | Ht 66.0 in | Wt 115.0 lb

## 2022-12-04 DIAGNOSIS — H25013 Cortical age-related cataract, bilateral: Secondary | ICD-10-CM

## 2022-12-04 DIAGNOSIS — Z7689 Persons encountering health services in other specified circumstances: Secondary | ICD-10-CM | POA: Diagnosis not present

## 2022-12-04 DIAGNOSIS — E785 Hyperlipidemia, unspecified: Secondary | ICD-10-CM

## 2022-12-04 DIAGNOSIS — H6123 Impacted cerumen, bilateral: Secondary | ICD-10-CM

## 2022-12-04 DIAGNOSIS — I10 Essential (primary) hypertension: Secondary | ICD-10-CM

## 2022-12-04 DIAGNOSIS — R634 Abnormal weight loss: Secondary | ICD-10-CM

## 2022-12-04 DIAGNOSIS — K219 Gastro-esophageal reflux disease without esophagitis: Secondary | ICD-10-CM | POA: Diagnosis not present

## 2022-12-04 MED ORDER — DEBROX 6.5 % OT SOLN: 5.0000 [drp] | Freq: Two times a day (BID) | OTIC | 0 refills | Status: DC

## 2022-12-04 NOTE — Patient Instructions (Signed)
-   Instill debrox 6.5 otic solution 5 drops into each ear twice daily x 4 days then follow up for ear lavage.May apply cotton ball at bedtime to prevent drainage to pillow.  

## 2022-12-04 NOTE — Progress Notes (Addendum)
Provider: Marlowe Sax FNP-C   Vincente Liberty, MD  Patient Care Team: Vincente Liberty, MD as PCP - General (Pulmonary Disease)  Extended Emergency Contact Information Primary Emergency Contact: Godfrey,[name] Address: [id number]          [place], [place] [address] [place] Phone: [phone] Mobile Phone: [phone] Relation: Son Secondary Emergency Contact: [place] Mobile Phone: [phone] Relation: Grandson Preferred language: English Interpreter needed? No  Code Status:  Full Code  Goals of care: Advanced Directive information    12/03/2022    4:25 PM  Advanced Directives  Does Patient Have a Medical Advance Directive? Yes  Type of Paramedic of [place];Living will  Does patient want to make changes to medical advance directive? No - Patient declined  Copy of [place] in Chart? Yes - validated most recent copy scanned in chart (See row information)     Chief Complaint  Patient presents with   Establish Care    New patient to establish care. Pill bottles present at initial appointment. [facility] verified.     HPI:  Pt is a 89 y.o. male seen today.establish care here at [facility] and Adult  care for medical management of chronic diseases.Here with son who provides HPI information. Has a medical history of hypertension,Hyperlipidemia,GERD,Dementia,hx of stroke,Glaucoma,BPH,Osteoarthritis among others.   He does not check blood pressure at home.He denies any headache,dizziness,vision changes,fatigue,chest tightness,palpitation,chest pain or shortness of breath.     Follows up with Neurologist for memory loss. Son states has had weight loss.Tends not to eat at home but usually eats well when they go out to eat in a restaurant.  No recent Fall episode.  He denies any acute issues today.   Past Medical History:  Diagnosis Date   Arthritis    hp bone,knees,hands,shoulders   BPH  (benign prostatic hyperplasia)    Diverticulosis    GERD (gastroesophageal reflux disease)    Glaucoma    Hyperlipemia    Hypertension    Loss of appetite    MCI (mild cognitive impairment)    Memory impairment    Sleep apnea    Past Surgical History:  Procedure Laterality Date   BUNIONECTOMY Left 05/16/2016   CATARACT EXTRACTION Bilateral 02/2013   TRANSURETHRAL RESECTION OF PROSTATE  1997    Allergies  Allergen Reactions   Donepezil     Allergies as of 12/04/2022       Reactions   Donepezil         Medication List        Accurate as of December 04, 2022  1:01 PM. If you have any questions, ask your nurse or doctor.          aspirin EC 81 MG tablet Take 81 mg by mouth daily. Swallow whole.   atorvastatin 40 MG tablet Commonly known as: LIPITOR Take 1 tablet by mouth once daily   D3 ADULT PO Take by mouth.   esomeprazole 40 MG capsule Commonly known as: NEXIUM Take 40 mg by mouth daily at 12 noon.   ipratropium 0.03 % nasal spray Commonly known as: ATROVENT Place 2 sprays into the nose 3 (three) times daily.   losartan 50 MG tablet Commonly known as: COZAAR 1 tablet   memantine 5 MG tablet Commonly known as: Namenda Take 1 tablet (5 mg total) by mouth 2 (two) times daily.   One Daily Complete for Men Tabs See admin instructions.        Review  of Systems  Constitutional:  Negative for appetite change, chills, fatigue, fever and unexpected weight change.  HENT:  Negative for congestion, dental problem, ear discharge, ear pain, facial swelling, hearing loss, nosebleeds, postnasal drip, rhinorrhea, sinus pressure, sinus pain, sneezing, sore throat, tinnitus and trouble swallowing.   Eyes:  Positive for visual disturbance. Negative for pain, discharge, redness and itching.       Wears eye glasses but has nit had an exam  Respiratory:  Negative for cough, chest tightness, shortness of breath and wheezing.   Cardiovascular:  Negative for chest  pain, palpitations and leg swelling.  Gastrointestinal:  Negative for abdominal distention, abdominal pain, blood in stool, constipation, diarrhea, nausea and vomiting.  Endocrine: Negative for cold intolerance, heat intolerance, polydipsia, polyphagia and polyuria.  Genitourinary:  Negative for difficulty urinating, dysuria, flank pain, frequency and urgency.  Musculoskeletal:  Negative for arthralgias, back pain, gait problem, joint swelling, myalgias, neck pain and neck stiffness.  Skin:  Negative for color change, pallor, rash and wound.  Neurological:  Negative for dizziness, syncope, speech difficulty, weakness, light-headedness, numbness and headaches.  Hematological:  Does not bruise/bleed easily.  Psychiatric/Behavioral:  Negative for agitation, behavioral problems, confusion, hallucinations, self-injury, sleep disturbance and suicidal ideas. The patient is not nervous/anxious.        Memory loss     Immunization History  Administered Date(s) Administered   Covid-19, Mrna,Vaccine(Spikevax)19yrs and older 08/03/2022   Influenza, High Dose Seasonal PF 07/15/2022   PFIZER Comirnaty(Gray Top)Covid-19 Tri-Sucrose Vaccine 07/21/2020   PFIZER(Purple Top)SARS-COV-2 Vaccination 11/21/2019, 12/12/2019   Pfizer Covid-19 Vaccine Bivalent Booster 54yrs & up 09/23/2021   Pneumococcal Conjugate-13 12/09/2017, 05/27/2018   Pneumococcal Polysaccharide-23 04/18/2021   Tdap 04/17/2018   Zoster Recombinat (Shingrix) 08/20/2018, 04/18/2021   Pertinent  Health Maintenance Due  Topic Date Due   INFLUENZA VACCINE  Completed      10/25/2021    8:29 AM  Fall Risk  (RETIRED) Patient Fall Risk Level Low fall risk   Functional Status Survey:    Vitals:   12/04/22 1259  BP: (!) 138/90  Pulse: 61  Resp: 18  Temp: 98.2 F (36.8 C)  SpO2: 99%  Weight: 115 lb (52.2 kg)  Height: 5\' 6"  (1.676 m)   Body mass index is 18.56 kg/m. Physical Exam Vitals reviewed.  Constitutional:      General:  He is not in acute distress.    Appearance: Normal appearance. He is underweight. He is not ill-appearing or diaphoretic.  HENT:     Head: Normocephalic.     Right Ear: There is impacted cerumen.     Left Ear: There is impacted cerumen.     Nose: Nose normal. No congestion or rhinorrhea.     Mouth/Throat:     Mouth: Mucous membranes are moist.     Pharynx: Oropharynx is clear. No oropharyngeal exudate or posterior oropharyngeal erythema.  Eyes:     General: No scleral icterus.       Right eye: No discharge.        Left eye: No discharge.     Extraocular Movements: Extraocular movements intact.     Conjunctiva/sclera: Conjunctivae normal.     Pupils: Pupils are equal, round, and reactive to light.     Comments: Cataract bilateral   Neck:     Vascular: No carotid bruit.  Cardiovascular:     Rate and Rhythm: Normal rate and regular rhythm.     Pulses: Normal pulses.     Heart sounds: Normal heart sounds.  No murmur heard.    No friction rub. No gallop.  Pulmonary:     Effort: Pulmonary effort is normal. No respiratory distress.     Breath sounds: Normal breath sounds. No wheezing, rhonchi or rales.  Chest:     Chest wall: No tenderness.  Abdominal:     General: Bowel sounds are normal. There is no distension.     Palpations: Abdomen is soft. There is no mass.     Tenderness: There is no abdominal tenderness. There is no right CVA tenderness, left CVA tenderness, guarding or rebound.  Musculoskeletal:        General: No swelling or tenderness. Normal range of motion.     Cervical back: Normal range of motion. No rigidity or tenderness.     Right lower leg: No edema.     Left lower leg: No edema.  Lymphadenopathy:     Cervical: No cervical adenopathy.  Skin:    General: Skin is warm and dry.     Coloration: Skin is not pale.     Findings: No bruising, erythema, lesion or rash.  Neurological:     Mental Status: He is alert. Mental status is at baseline.     Cranial Nerves: No  cranial nerve deficit.     Sensory: No sensory deficit.     Motor: No weakness.     Coordination: Coordination normal.     Gait: Gait abnormal.  Psychiatric:        Mood and Affect: Mood normal.        Speech: Speech normal.        Behavior: Behavior normal.        Thought Content: Thought content normal.        Cognition and Memory: Memory is impaired. He exhibits impaired recent memory.        Judgment: Judgment normal.    Labs reviewed: No results for input(s): "NA", "K", "CL", "CO2", "GLUCOSE", "BUN", "CREATININE", "CALCIUM", "MG", "PHOS" in the last 8760 hours. No results for input(s): "AST", "ALT", "ALKPHOS", "BILITOT", "PROT", "ALBUMIN" in the last 8760 hours. No results for input(s): "WBC", "NEUTROABS", "HGB", "HCT", "MCV", "PLT" in the last 8760 hours. Lab Results  Component Value Date   TSH 2.180 01/30/2022   Lab Results  Component Value Date   HGBA1C 5.6 08/05/2022   Lab Results  Component Value Date   CHOL 131 08/05/2022   HDL 60 08/05/2022   LDLCALC 59 08/05/2022   TRIG 54 08/05/2022   CHOLHDL 2.2 08/05/2022    Significant Diagnostic Results in last 30 days:  No results found.  Assessment/Plan 1. Encounter to establish care Available medical records reviewed,immunization up to date except due for COVID-19 vaccine.Notified to get vaccine at the pharmacy.recommended fasting labs draw today.   2. Primary hypertension B/p stable  - continue on losartan  - TSH - COMPLETE METABOLIC PANEL WITH GFR - CBC with Differential/Platelet  3. Gastroesophageal reflux disease without esophagitis Continue on esomeprazole  - COMPLETE METABOLIC PANEL WITH GFR - CBC with Differential/Platelet  4. Hyperlipidemia LDL goal <100 LDL at gaol  - continue on atorvastatin  - Lipid panel  5. Weight loss, abnormal Has had 10 lbs weight loss over 4 lbs  - advised to drink Ensure one bottle daily - TSH  6. Bilateral impacted cerumen Bilateral TM not visualized due to  cerumen impaction. - Advised to Instill debrox 6.5 otic solution 5 drops into each ear twice daily x 4 days then follow up for ear lavage.May  apply cotton ball at bedtime to prevent drainage to pillow. - carbamide peroxide (DEBROX) 6.5 % OTIC solution; Place 5 drops into both ears 2 (two) times daily.  Dispense: 15 mL; Refill: 0 - Follow up in one week for bilateral ear lavage.   7. Cortical age-related cataract of both eyes Continue to follow up with Ophthalmology as scheduled    Family/ staff Communication: Reviewed plan of care with patient and POA verbalized understanding.   Labs/tests ordered:  - TSH - COMPLETE METABOLIC PANEL WITH GFR - CBC with Differential/Platelet - Lipid panel - TSH  Next Appointment : Return in about 6 months (around 06/04/2023) for medical mangement of chronic issues.Annual Wellness visit soon. one week for ear lavage bilateral.   Sandrea Hughs, NP

## 2022-12-05 LAB — CBC WITH DIFFERENTIAL/PLATELET
Absolute Monocytes: 382 cells/uL (ref 200–950)
Basophils Absolute: 42 cells/uL (ref 0–200)
Basophils Relative: 1 %
Eosinophils Absolute: 71 cells/uL (ref 15–500)
Eosinophils Relative: 1.7 %
HCT: 40.2 % (ref 38.5–50.0)
Hemoglobin: 13.9 g/dL (ref 13.2–17.1)
Lymphs Abs: 1705 cells/uL (ref 850–3900)
MCH: 34 pg — ABNORMAL HIGH (ref 27.0–33.0)
MCHC: 34.6 g/dL (ref 32.0–36.0)
MCV: 98.3 fL (ref 80.0–100.0)
MPV: 10 fL (ref 7.5–12.5)
Monocytes Relative: 9.1 %
Neutro Abs: 1999 cells/uL (ref 1500–7800)
Neutrophils Relative %: 47.6 %
Platelets: 237 10*3/uL (ref 140–400)
RBC: 4.09 10*6/uL — ABNORMAL LOW (ref 4.20–5.80)
RDW: 12.3 % (ref 11.0–15.0)
Total Lymphocyte: 40.6 %
WBC: 4.2 10*3/uL (ref 3.8–10.8)

## 2022-12-05 LAB — COMPLETE METABOLIC PANEL WITH GFR
AG Ratio: 1.4 (calc) (ref 1.0–2.5)
ALT: 6 U/L — ABNORMAL LOW (ref 9–46)
AST: 11 U/L (ref 10–35)
Albumin: 4 g/dL (ref 3.6–5.1)
Alkaline phosphatase (APISO): 81 U/L (ref 35–144)
BUN: 13 mg/dL (ref 7–25)
CO2: 28 mmol/L (ref 20–32)
Calcium: 10.6 mg/dL — ABNORMAL HIGH (ref 8.6–10.3)
Chloride: 103 mmol/L (ref 98–110)
Creat: 0.91 mg/dL (ref 0.70–1.22)
Globulin: 2.9 g/dL (calc) (ref 1.9–3.7)
Glucose, Bld: 86 mg/dL (ref 65–99)
Potassium: 4 mmol/L (ref 3.5–5.3)
Sodium: 140 mmol/L (ref 135–146)
Total Bilirubin: 0.7 mg/dL (ref 0.2–1.2)
Total Protein: 6.9 g/dL (ref 6.1–8.1)
eGFR: 81 mL/min/{1.73_m2} (ref >=60)

## 2022-12-05 LAB — LIPID PANEL
Cholesterol: 124 mg/dL (ref <200)
HDL: 54 mg/dL (ref >=40)
LDL Cholesterol (Calc): 57 mg/dL (calc)
Non-HDL Cholesterol (Calc): 70 mg/dL (calc) (ref <130)
Total CHOL/HDL Ratio: 2.3 (calc) (ref <5.0)
Triglycerides: 58 mg/dL (ref <150)

## 2022-12-05 LAB — TSH: TSH: 1.75 mIU/L (ref 0.40–4.50)

## 2022-12-10 ENCOUNTER — Ambulatory Visit (INDEPENDENT_AMBULATORY_CARE_PROVIDER_SITE_OTHER): Payer: PPO | Admitting: Family

## 2022-12-10 ENCOUNTER — Encounter: Payer: Self-pay | Admitting: Family

## 2022-12-10 VITALS — BP 120/80 | HR 76 | Temp 97.1°F | Resp 18 | Ht 66.0 in | Wt 120.5 lb

## 2022-12-10 DIAGNOSIS — H6123 Impacted cerumen, bilateral: Secondary | ICD-10-CM

## 2022-12-10 NOTE — Progress Notes (Signed)
Provider: Marlowe Sax FNP-C  Heavenleigh Petruzzi, Nelda Bucks, NP  Patient Care Team: Khamari Yousuf, Nelda Bucks, NP as PCP - General (Family Medicine)  Extended Emergency Contact Information Primary Emergency Contact: Beezley,[name] Address: [id number]          [place], [place] [address] [place] Phone: [phone] Mobile Phone: [phone] Relation: Son Secondary Emergency Contact: [employer] Mobile Phone: [phone] Relation: Grandson Preferred language: English Interpreter needed? No  Code Status:  Full Code  Goals of care: Advanced Directive information    12/03/2022    4:25 PM  Advanced Directives  Does Patient Have a Medical Advance Directive? Yes  Type of Paramedic of [place];Living will  Does patient want to make changes to medical advance directive? No - Patient declined  Copy of [place] in Chart? Yes - validated most recent copy scanned in chart (See row information)     Chief Complaint  Patient presents with   Medical Management of Chronic Issues    Ear lavage.    HPI:  Pt is a 89 y.o. male seen today for an acute visit for bilateral ear lavage.He was here 12/04/2022 to establish care bilateral cerumen impaction was noted. He was advised to instil debrox 6.5 % otic solution 5 drops into each ear twice daily x 4 days then follow up today for ear lavage.Here with the son states has used drops as directed.He denies any fever,chills,pain or ringing in the ears.    Past Medical History:  Diagnosis Date   Arthritis    hp bone,knees,hands,shoulders   BPH (benign prostatic hyperplasia)    BPH (benign prostatic hyperplasia)    Cataract    Diverticulosis    GERD (gastroesophageal reflux disease)    Glaucoma    Hyperlipemia    Hypertension    Loss of appetite    MCI (mild cognitive impairment)    Memory impairment    Sleep apnea    Past Surgical History:  Procedure Laterality Date   BUNIONECTOMY Left  05/16/2016   CATARACT EXTRACTION Bilateral 02/2013   TRANSURETHRAL RESECTION OF PROSTATE  1997    Allergies  Allergen Reactions   Donepezil     Outpatient Encounter Medications as of 12/10/2022  Medication Sig   aspirin EC 81 MG tablet Take 81 mg by mouth daily. Swallow whole.   atorvastatin (LIPITOR) 40 MG tablet Take 1 tablet by mouth once daily   carbamide peroxide (DEBROX) 6.5 % OTIC solution Place 5 drops into both ears 2 (two) times daily.   Cholecalciferol (D3 ADULT PO) Take by mouth.   esomeprazole (NEXIUM) 40 MG capsule Take 40 mg by mouth daily at 12 noon.   ipratropium (ATROVENT) 0.03 % nasal spray Place 2 sprays into the nose 3 (three) times daily.   losartan (COZAAR) 50 MG tablet 1 tablet   memantine (NAMENDA) 5 MG tablet Take 1 tablet (5 mg total) by mouth 2 (two) times daily.   Multiple Vitamins-Minerals (ONE DAILY COMPLETE FOR MEN) TABS See admin instructions.   No facility-administered encounter medications on file as of 12/10/2022.    Review of Systems  Constitutional:  Negative for appetite change, chills, fatigue, fever and unexpected weight change.  HENT:  Negative for congestion, dental problem, ear discharge, ear pain, hearing loss, postnasal drip, rhinorrhea, sinus pressure, sinus pain, sneezing, sore throat and tinnitus.   Eyes:  Negative for pain, discharge, redness, itching and visual disturbance.  Respiratory:  Negative for cough, chest tightness, shortness of  breath and wheezing.   Cardiovascular:  Negative for chest pain, palpitations and leg swelling.  Skin:  Negative for color change, pallor, rash and wound.  Neurological:  Negative for dizziness, light-headedness and headaches.    Immunization History  Administered Date(s) Administered   Covid-19, Mrna,Vaccine(Spikevax)87yr and older 08/03/2022   Influenza, High Dose Seasonal PF 07/15/2022   PFIZER Comirnaty(Gray Top)Covid-19 Tri-Sucrose Vaccine 07/21/2020   PFIZER(Purple Top)SARS-COV-2  Vaccination 11/21/2019, 12/12/2019   Pfizer Covid-19 Vaccine Bivalent Booster 124yr& up 09/23/2021   Pneumococcal Conjugate-13 12/09/2017, 05/27/2018   Pneumococcal Polysaccharide-23 04/18/2021   Tdap 04/17/2018   Zoster Recombinat (Shingrix) 08/20/2018, 04/18/2021   Pertinent  Health Maintenance Due  Topic Date Due   INFLUENZA VACCINE  Completed      10/25/2021    8:29 AM 12/10/2022    9:30 AM  Fall Risk  Falls in the past year?  0  Was there an injury with Fall?  0  Fall Risk Category Calculator  0  (RETIRED) Patient Fall Risk Level Low fall risk   Patient at Risk for Falls Due to  No Fall Risks  Fall risk Follow up  Falls evaluation completed   Functional Status Survey:    Vitals:   12/10/22 0931  BP: 120/80  Pulse: 76  Resp: 18  Temp: (!) 97.1 F (36.2 C)  SpO2: 96%  Weight: 120 lb 8 oz (54.7 kg)  Height: 5' 6"$  (1.676 m)   Body mass index is 19.45 kg/m. Physical Exam Vitals reviewed.  Constitutional:      General: He is not in acute distress.    Appearance: Normal appearance. He is normal weight. He is not ill-appearing or diaphoretic.  HENT:     Head: Normocephalic.     Right Ear: There is impacted cerumen.     Left Ear: There is impacted cerumen.     Ears:     Comments: Bilateral ear cerumen lavaged with warm water and hydrogen peroxide moderate amounts of cerumen obtained.curette used.Tolerated procedure well.TM clear without any signs of infection.     Nose: Nose normal. No congestion or rhinorrhea.     Mouth/Throat:     Mouth: Mucous membranes are moist.     Pharynx: Oropharynx is clear. No oropharyngeal exudate or posterior oropharyngeal erythema.  Eyes:     General: No scleral icterus.       Right eye: No discharge.        Left eye: No discharge.     Conjunctiva/sclera: Conjunctivae normal.     Pupils: Pupils are equal, round, and reactive to light.  Neck:     Vascular: No carotid bruit.  Cardiovascular:     Rate and Rhythm: Normal rate and  regular rhythm.     Pulses: Normal pulses.     Heart sounds: Normal heart sounds. No murmur heard.    No friction rub. No gallop.  Pulmonary:     Effort: Pulmonary effort is normal. No respiratory distress.     Breath sounds: Normal breath sounds. No wheezing, rhonchi or rales.  Chest:     Chest wall: No tenderness.  Musculoskeletal:     Cervical back: Normal range of motion. No rigidity or tenderness.  Lymphadenopathy:     Cervical: No cervical adenopathy.  Skin:    General: Skin is warm and dry.     Coloration: Skin is not pale.     Findings: No bruising, erythema, lesion or rash.  Neurological:     Mental Status: He is alert and  oriented to person, place, and time.     Cranial Nerves: No cranial nerve deficit.     Sensory: No sensory deficit.     Gait: Gait normal.  Psychiatric:        Mood and Affect: Mood normal.        Speech: Speech normal.        Behavior: Behavior normal.     Labs reviewed: Recent Labs    12/04/22 1520  NA 140  K 4.0  CL 103  CO2 28  GLUCOSE 86  BUN 13  CREATININE 0.91  CALCIUM 10.6*   Recent Labs    12/04/22 1520  AST 11  ALT 6*  BILITOT 0.7  PROT 6.9   Recent Labs    12/04/22 1520  WBC 4.2  NEUTROABS 1,999  HGB 13.9  HCT 40.2  MCV 98.3  PLT 237   Lab Results  Component Value Date   TSH 1.75 12/04/2022   Lab Results  Component Value Date   HGBA1C 5.6 08/05/2022   Lab Results  Component Value Date   CHOL 124 12/04/2022   HDL 54 12/04/2022   LDLCALC 57 12/04/2022   TRIG 58 12/04/2022   CHOLHDL 2.3 12/04/2022    Significant Diagnostic Results in last 30 days:  No results found.  Assessment/Plan  Bilateral impacted cerumen Afebrile  Bilateral ear cerumen lavaged with warm water and hydrogen peroxide moderate amounts of cerumen obtained.curette used.Tolerated procedure well.TM clear without any signs of infection. - advised to notify provider if running any fever,chills or pain in the ears.   Family/ staff  Communication: Reviewed plan of care with patient verbalized understanding.   Labs/tests ordered: None   Next Appointment: Return if symptoms worsen or fail to improve.   Sandrea Hughs, NP

## 2022-12-30 ENCOUNTER — Other Ambulatory Visit: Payer: Self-pay

## 2022-12-30 ENCOUNTER — Emergency Department (HOSPITAL_COMMUNITY): Payer: PPO

## 2022-12-30 ENCOUNTER — Encounter (HOSPITAL_COMMUNITY): Payer: Self-pay

## 2022-12-30 ENCOUNTER — Observation Stay (HOSPITAL_COMMUNITY)
Admission: EM | Admit: 2022-12-30 | Discharge: 2022-12-31 | Disposition: A | Discharge status: Home / Self Care | Payer: PPO | Attending: Family Medicine | Admitting: Family Medicine

## 2022-12-30 DIAGNOSIS — K529 Noninfective gastroenteritis and colitis, unspecified: Secondary | ICD-10-CM | POA: Diagnosis not present

## 2022-12-30 DIAGNOSIS — R112 Nausea with vomiting, unspecified: Secondary | ICD-10-CM | POA: Insufficient documentation

## 2022-12-30 DIAGNOSIS — N179 Acute kidney failure, unspecified: Secondary | ICD-10-CM | POA: Diagnosis not present

## 2022-12-30 DIAGNOSIS — Z1152 Encounter for screening for COVID-19: Secondary | ICD-10-CM | POA: Diagnosis not present

## 2022-12-30 DIAGNOSIS — R197 Diarrhea, unspecified: Secondary | ICD-10-CM | POA: Insufficient documentation

## 2022-12-30 DIAGNOSIS — I10 Essential (primary) hypertension: Secondary | ICD-10-CM | POA: Insufficient documentation

## 2022-12-30 DIAGNOSIS — R509 Fever, unspecified: Secondary | ICD-10-CM | POA: Insufficient documentation

## 2022-12-30 DIAGNOSIS — Z8673 Personal history of transient ischemic attack (TIA), and cerebral infarction without residual deficits: Secondary | ICD-10-CM | POA: Diagnosis not present

## 2022-12-30 DIAGNOSIS — R531 Weakness: Secondary | ICD-10-CM | POA: Diagnosis present

## 2022-12-30 DIAGNOSIS — Z79899 Other long term (current) drug therapy: Secondary | ICD-10-CM | POA: Diagnosis not present

## 2022-12-30 DIAGNOSIS — R410 Disorientation, unspecified: Secondary | ICD-10-CM | POA: Diagnosis not present

## 2022-12-30 DIAGNOSIS — R0602 Shortness of breath: Secondary | ICD-10-CM | POA: Diagnosis not present

## 2022-12-30 DIAGNOSIS — K219 Gastro-esophageal reflux disease without esophagitis: Secondary | ICD-10-CM | POA: Insufficient documentation

## 2022-12-30 DIAGNOSIS — G309 Alzheimer's disease, unspecified: Secondary | ICD-10-CM

## 2022-12-30 DIAGNOSIS — E860 Dehydration: Secondary | ICD-10-CM | POA: Diagnosis not present

## 2022-12-30 DIAGNOSIS — R2681 Unsteadiness on feet: Secondary | ICD-10-CM | POA: Insufficient documentation

## 2022-12-30 DIAGNOSIS — R1110 Vomiting, unspecified: Secondary | ICD-10-CM | POA: Diagnosis not present

## 2022-12-30 DIAGNOSIS — Z7982 Long term (current) use of aspirin: Secondary | ICD-10-CM | POA: Diagnosis not present

## 2022-12-30 DIAGNOSIS — F0390 Unspecified dementia without behavioral disturbance: Secondary | ICD-10-CM

## 2022-12-30 DIAGNOSIS — R32 Unspecified urinary incontinence: Secondary | ICD-10-CM | POA: Diagnosis not present

## 2022-12-30 DIAGNOSIS — F0280 Dementia in other diseases classified elsewhere without behavioral disturbance: Secondary | ICD-10-CM

## 2022-12-30 HISTORY — DX: Personal history of transient ischemic attack (TIA), and cerebral infarction without residual deficits: Z86.73

## 2022-12-30 HISTORY — DX: Unspecified dementia, unspecified severity, without behavioral disturbance, psychotic disturbance, mood disturbance, and anxiety: F03.90

## 2022-12-30 LAB — RESP PANEL BY RT-PCR (RSV, FLU A&B, COVID)  RVPGX2
Influenza A by PCR: NEGATIVE
Influenza B by PCR: NEGATIVE
Resp Syncytial Virus by PCR: NEGATIVE
SARS Coronavirus 2 by RT PCR: NEGATIVE

## 2022-12-30 LAB — RESPIRATORY PANEL BY PCR

## 2022-12-30 LAB — CBC
HCT: 37.3 % — ABNORMAL LOW (ref 39.0–52.0)
Hemoglobin: 13.1 g/dL (ref 13.0–17.0)
MCH: 33.7 pg (ref 26.0–34.0)
MCHC: 35.1 g/dL (ref 30.0–36.0)
MCV: 95.9 fL (ref 80.0–100.0)
Platelets: 187 10*3/uL (ref 150–400)
RBC: 3.89 MIL/uL — ABNORMAL LOW (ref 4.22–5.81)
RDW: 13.1 % (ref 11.5–15.5)
WBC: 6.5 10*3/uL (ref 4.0–10.5)
nRBC: 0 % (ref 0.0–0.2)

## 2022-12-30 LAB — COMPREHENSIVE METABOLIC PANEL
ALT: 10 U/L (ref 0–44)
AST: 20 U/L (ref 15–41)
Albumin: 3.5 g/dL (ref 3.5–5.0)
Alkaline Phosphatase: 69 U/L (ref 38–126)
Anion gap: 9 (ref 5–15)
BUN: 14 mg/dL (ref 8–23)
CO2: 26 mmol/L (ref 22–32)
Calcium: 10.7 mg/dL — ABNORMAL HIGH (ref 8.9–10.3)
Chloride: 106 mmol/L (ref 98–111)
Creatinine, Ser: 1.34 mg/dL — ABNORMAL HIGH (ref 0.61–1.24)
GFR, Estimated: 51 mL/min — ABNORMAL LOW (ref >60)
Glucose, Bld: 109 mg/dL — ABNORMAL HIGH (ref 70–99)
Potassium: 3.7 mmol/L (ref 3.5–5.1)
Sodium: 141 mmol/L (ref 135–145)
Total Bilirubin: 0.8 mg/dL (ref 0.3–1.2)
Total Protein: 6.5 g/dL (ref 6.5–8.1)

## 2022-12-30 LAB — LACTIC ACID, PLASMA
Lactic Acid, Venous: 3.4 mmol/L (ref 0.5–1.9)
Lactic Acid, Venous: 3 mmol/L (ref 0.5–1.9)

## 2022-12-30 LAB — LIPASE, BLOOD: Lipase: 35 U/L (ref 11–51)

## 2022-12-30 LAB — URINALYSIS, ROUTINE W REFLEX MICROSCOPIC
Bacteria, UA: NONE SEEN
Bilirubin Urine: NEGATIVE
Glucose, UA: NEGATIVE mg/dL
Ketones, ur: NEGATIVE mg/dL
Leukocytes,Ua: NEGATIVE
Nitrite: NEGATIVE
Protein, ur: NEGATIVE mg/dL
Specific Gravity, Urine: 1.009 (ref 1.005–1.030)
pH: 7 (ref 5.0–8.0)

## 2022-12-30 MED ORDER — LACTATED RINGERS IV SOLN: INTRAVENOUS | Status: DC

## 2022-12-30 MED ORDER — METRONIDAZOLE 500 MG/100ML IV SOLN
500.0000 mg | Freq: Once | INTRAVENOUS | Status: AC
  Administered 2022-12-30: 500 mg via INTRAVENOUS
  Filled 2022-12-30: qty 100

## 2022-12-30 MED ORDER — LACTATED RINGERS IV SOLN: INTRAVENOUS | Status: AC

## 2022-12-30 MED ORDER — ONDANSETRON HCL 4 MG/2ML IJ SOLN: 4.0000 mg | Freq: Four times a day (QID) | INTRAMUSCULAR | Status: DC | PRN

## 2022-12-30 MED ORDER — SODIUM CHLORIDE 0.9 % IV SOLN
2.0000 g | Freq: Once | INTRAVENOUS | Status: AC
  Administered 2022-12-30: 2 g via INTRAVENOUS
  Filled 2022-12-30: qty 20

## 2022-12-30 MED ORDER — SODIUM CHLORIDE 0.9 % IV BOLUS (SEPSIS)
1000.0000 mL | Freq: Once | INTRAVENOUS | Status: AC
  Administered 2022-12-30: 1000 mL via INTRAVENOUS

## 2022-12-30 MED ORDER — LACTATED RINGERS IV BOLUS (SEPSIS)
1000.0000 mL | Freq: Once | INTRAVENOUS | Status: AC
  Administered 2022-12-30: 1000 mL via INTRAVENOUS

## 2022-12-30 MED ORDER — ONDANSETRON HCL 4 MG PO TABS: 4.0000 mg | ORAL_TABLET | Freq: Four times a day (QID) | ORAL | Status: DC | PRN

## 2022-12-30 MED ORDER — ACETAMINOPHEN 325 MG PO TABS: 650.0000 mg | ORAL_TABLET | Freq: Four times a day (QID) | ORAL | Status: DC | PRN

## 2022-12-30 MED ORDER — ACETAMINOPHEN 325 MG PO TABS
650.0000 mg | ORAL_TABLET | Freq: Once | ORAL | Status: AC
  Administered 2022-12-30: 650 mg via ORAL
  Filled 2022-12-30: qty 2

## 2022-12-30 MED ORDER — ACETAMINOPHEN 650 MG RE SUPP: 650.0000 mg | Freq: Four times a day (QID) | RECTAL | Status: DC | PRN

## 2022-12-30 MED ORDER — LACTATED RINGERS IV BOLUS (SEPSIS)
500.0000 mL | Freq: Once | INTRAVENOUS | Status: AC
  Administered 2022-12-30: 500 mL via INTRAVENOUS

## 2022-12-30 MED ORDER — ASPIRIN 81 MG PO TBEC
81.0000 mg | DELAYED_RELEASE_TABLET | Freq: Every day | ORAL | Status: DC
  Administered 2022-12-30 – 2022-12-31 (×2): 81 mg via ORAL
  Filled 2022-12-30 (×2): qty 1

## 2022-12-30 MED ORDER — LACTATED RINGERS IV BOLUS (SEPSIS): 250.0000 mL | Freq: Once | INTRAVENOUS | Status: DC

## 2022-12-30 MED ORDER — MEMANTINE HCL 10 MG PO TABS
5.0000 mg | ORAL_TABLET | Freq: Two times a day (BID) | ORAL | Status: DC
  Administered 2022-12-30 – 2022-12-31 (×3): 5 mg via ORAL
  Filled 2022-12-30 (×3): qty 1

## 2022-12-30 MED ORDER — ATORVASTATIN CALCIUM 40 MG PO TABS
40.0000 mg | ORAL_TABLET | Freq: Every day | ORAL | Status: DC
  Administered 2022-12-30 – 2022-12-31 (×2): 40 mg via ORAL
  Filled 2022-12-30 (×2): qty 1

## 2022-12-30 MED ORDER — HEPARIN SODIUM (PORCINE) 5000 UNIT/ML IJ SOLN
5000.0000 [IU] | Freq: Three times a day (TID) | INTRAMUSCULAR | Status: DC
  Administered 2022-12-30 – 2022-12-31 (×3): 5000 [IU] via SUBCUTANEOUS
  Filled 2022-12-30 (×3): qty 1

## 2022-12-30 NOTE — ED Notes (Signed)
Pt resting on stretcher denies any complaints

## 2022-12-30 NOTE — Subjective & Objective (Signed)
CC: vomiting and weakness HPI: 89 year old African-American male history of prior stroke, dementia, hypertension presents to the ER today from home via EMS.  Reportedly had some weakness and some vomiting.  Family is no longer here in the ER.  Reportedly patient had 2 episodes of vomiting.  Patient was incontinent of stool.  On arrival temp 103, heart rate 88, blood pressure 124/67.  Labs showed a white count of 6.5, hemoglobin 13.1, platelets of 187  Sodium 141, BUN of 14, creatinine 1.3  2 weeks ago his BUN was 13 and creatinine 0.9  Checks x-ray was negative for pneumonia.  COVID-negative, influenza negative, RSV negative.  [facility] hospitalist contacted for admission.

## 2022-12-30 NOTE — Assessment & Plan Note (Signed)
Continue with IVF ?

## 2022-12-30 NOTE — Assessment & Plan Note (Signed)
Observation med/surg bed. Continue with IVF. Check viral panel due to fever. Cxr negative. Still awaiting UA. Given 1 dose of  IV ABX in ER with rocephin and flagyl. Will hold further abx until we know what we are treating.

## 2022-12-30 NOTE — Sepsis Progress Note (Signed)
Elink monitoring for the code sepsis protocol.  

## 2022-12-30 NOTE — TOC Initial Note (Signed)
Transition of Care (TOC) - Initial/Assessment Note    Patient Details  Name: Jason PUELLO Sr. MRN: [id number] Date of Birth: 1931/08/04  Transition of Care [facility] [address]) CM/SW Contact:    Curlene Labrum, RN Phone Number: 12/30/2022, 1:37 PM  Clinical Narrative:                 CM met with the patient and sons, Jason Castaneda and Jason Castaneda, Jason Castaneda. at the bedside to discuss [facility] needs for the patient.  The patient was admitted for fever, dehydration, and AKI and was provided with Medicare Observation letter.  The patient lives with the son, Jason Castaneda, Jason Castaneda. At the home who provides 24 hour care and supervision at the home.  No DME is used by the patient at the home at this time.  No needs for TOC at this time but I will continue to follow the patient under discharge to home.  Expected Discharge Plan: Home/Self Care Barriers to Discharge: Continued Medical Work up   Patient Goals and CMS Choice Patient states their goals for this hospitalization and ongoing recovery are:: To return home with family CMS Medicare.gov Compare Post Acute Care list provided to:: Patient Represenative (must comment) (Patient's sons at bedside) Choice offered to / list presented to : Adult [place] ownership interest in [facility].provided to:: Adult Children    Expected Discharge Plan and Services   Discharge Planning Services: CM Consult   Living arrangements for the past 2 months: Single Family Home                                      Prior Living Arrangements/Services Living arrangements for the past 2 months: Single Family Home Lives with:: Adult Children (Patient lives with Jason Castaneda. in the home, who provides 24 hour supervision) Patient language and need for interpreter reviewed:: Yes Do you feel safe going back to the place where you live?: Yes      Need for Family Participation in Patient Care: Yes (Comment) Care giver support system in place?: Yes (comment) Current home  services:  (No DME in the home) Criminal Activity/Legal Involvement Pertinent to Current Situation/Hospitalization: No - Comment as needed  Activities of Daily Living Home Assistive Devices/Equipment: None ADL Screening (condition at time of admission) Patient's cognitive ability adequate to safely complete daily activities?: No Is the patient deaf or have difficulty hearing?: No Does the patient have difficulty seeing, even when wearing glasses/contacts?: No Does the patient have difficulty concentrating, remembering, or making decisions?: Yes Patient able to express need for assistance with ADLs?: Yes Does the patient have difficulty dressing or bathing?: Yes Independently performs ADLs?: No Communication: Independent Is this a change from baseline?: Pre-admission baseline Dressing (OT): Dependent, Needs assistance Is this a change from baseline?: Pre-admission baseline Grooming: Dependent, Needs assistance Is this a change from baseline?: Pre-admission baseline Feeding: Independent Bathing: Needs assistance Is this a change from baseline?: Pre-admission baseline Toileting: Needs assistance Is this a change from baseline?: Pre-admission baseline In/Out Bed: Needs assistance Is this a change from baseline?: Pre-admission baseline Walks in Home: Needs assistance Is this a change from baseline?: Pre-admission baseline Does the patient have difficulty walking or climbing stairs?: No Weakness of Legs: None Weakness of Arms/Hands: None  Permission Sought/Granted Permission sought to share information with : Case Manager, Family Supports Permission granted to share information with : Yes, Verbal Permission Granted  Permission granted to share info w Relationship: son, Jason Castaneda [id number]     Emotional Assessment Appearance:: Appears stated age Attitude/Demeanor/Rapport: Gracious Affect (typically observed): Accepting Orientation: : Oriented to Self, Oriented to  Place, Oriented to  Time, Oriented to Situation Alcohol / Substance Use: Not Applicable Psych Involvement: No (comment)  Admission diagnosis:  Dehydration [E86.0] Acute gastroenteritis [K52.9] AKI (acute kidney injury) ([place]) [N17.9] Patient Active Problem List   Diagnosis Date Noted   AKI (acute kidney injury) ([place]) 12/30/2022   Dehydration 12/30/2022   Alzheimer's dementia without behavioral disturbance ([place]) 12/30/2022   Diverticulosis 03/23/2015   Hypertension    GERD (gastroesophageal reflux disease)    PCP:  Sandrea Hughs, NP Pharmacy:   [facility] [address], [place] - [place] [address] Phone: [phone] Fax: [phone]     Social Determinants of Health (SDOH) Social History: SDOH Screenings   Food Insecurity: No Food Insecurity (12/30/2022)  Housing: Low Risk  (12/30/2022)  Transportation Needs: No Transportation Needs (12/30/2022)  Utilities: Not At Risk (12/30/2022)  Depression (PHQ2-9): Low Risk  (12/10/2022)  Tobacco Use: Low Risk  (12/30/2022)   SDOH Interventions:     Readmission Risk Interventions     No data to display

## 2022-12-30 NOTE — Hospital Course (Addendum)
89 year old man PMH including dementia presented from home with weakness and vomiting.  Admitted for dehydration, AKI.  Noted to have 1 episode of fever.  Viral gastroenteritis considered.

## 2022-12-30 NOTE — ED Provider Notes (Signed)
[facility] Provider Note   CSN: [id number] Arrival date & time: 12/30/22  0047     History  Chief complaint-vomiting and diarrhea   Level 5 caveat due to acuity of condition/dementia Jason KILPELA Sr. is a 89 y.o. male.  HPI Patient with history of dementia presents from home for vomiting and diarrhea.  History is provided by his son.  Patient lives with family.  They report he has had episodes of vomiting, increased confusion, and he also had urinary incontinence.  He has also had loose stools.  No recent falls or trauma.     Home Medications Prior to Admission medications   Medication Sig Start Date End Date Taking? Authorizing Provider  aspirin EC 81 MG tablet Take 81 mg by mouth daily. Swallow whole.   Yes [provider]  atorvastatin (LIPITOR) 40 MG tablet Take 1 tablet by mouth once daily 08/21/22  Yes Chima, Anderson Malta, MD  Cholecalciferol (D3 ADULT PO) Take 1 tablet by mouth daily.   Yes [provider]  esomeprazole (NEXIUM) 40 MG capsule Take 40 mg by mouth daily as needed (for acid reflux).   Yes [provider]  losartan (COZAAR) 50 MG tablet Take 50 mg by mouth daily.   Yes [provider]  memantine (NAMENDA) 5 MG tablet Take 1 tablet (5 mg total) by mouth 2 (two) times daily. 08/05/22  Yes Genia Harold, MD  Multiple Vitamins-Minerals (ONE DAILY COMPLETE FOR MEN) TABS Take 1 tablet by mouth daily.   Yes [provider]      Allergies    Donepezil    Review of Systems   Review of Systems  Unable to perform ROS: Dementia    Physical Exam Updated Vital Signs BP 124/66 (BP Location: Right Arm)   Pulse 86   Temp 99.6 F (37.6 C) (Oral)   Resp 20   Ht 1.676 m ('5\' 6"'$ )   Wt 59 kg   SpO2 97%   BMI 20.98 kg/m  Physical Exam CONSTITUTIONAL: Elderly, no acute distress HEAD: Normocephalic/atraumatic EYES: EOMI/PERRL ENMT: Mucous membranes dry NECK: supple no  meningeal signs SPINE/BACK:entire spine nontender CV: S1/S2 noted, no murmurs/rubs/gallops noted LUNGS: Lungs are clear to auscultation bilaterally, no apparent distress ABDOMEN: soft, nontender, no rebound or guarding NEURO: Pt is awake/alert.  Patient appears pleasantly confused, but he is able to recognize family.  He moves all extremities without difficulty EXTREMITIES: pulses normal/equal, full ROM SKIN: warm, color normal   ED Results / Procedures / Treatments   Labs (all labs ordered are listed, but only abnormal results are displayed) Labs Reviewed  COMPREHENSIVE METABOLIC PANEL - Abnormal; Notable for the following components:      Result Value   Glucose, Bld 109 (*)    Creatinine, Ser 1.34 (*)    Calcium 10.7 (*)    GFR, Estimated 51 (*)    All other components within normal limits  CBC - Abnormal; Notable for the following components:   RBC 3.89 (*)    HCT 37.3 (*)    All other components within normal limits  LACTIC ACID, PLASMA - Abnormal; Notable for the following components:   Lactic Acid, Venous 3.4 (*)    All other components within normal limits  RESP PANEL BY RT-PCR (RSV, FLU A&B, COVID)  RVPGX2  CULTURE, BLOOD (ROUTINE X 2)  CULTURE, BLOOD (ROUTINE X 2)  GASTROINTESTINAL PANEL BY PCR, STOOL (REPLACES STOOL CULTURE)  C DIFFICILE QUICK SCREEN W PCR REFLEX  LIPASE, BLOOD  URINALYSIS, ROUTINE W REFLEX MICROSCOPIC  LACTIC ACID, PLASMA    EKG EKG Interpretation  Date/Time:  Tuesday December 30 2022 00:58:57 EST Ventricular Rate:  91 PR Interval:  178 QRS Duration: 93 QT Interval:  325 QTC Calculation: 400 R Axis:   79 Text Interpretation: Sinus rhythm Confirmed by Ripley Fraise [phone]) on 12/30/2022 1:04:33 AM  Radiology DG Chest Port 1 View  Result Date: 12/30/2022 CLINICAL DATA:  Possible sepsis EXAM: PORTABLE CHEST 1 VIEW COMPARISON:  07/25/2022 FINDINGS: Cardiac shadow is stable. Aortic calcifications are seen. The lungs are well aerated  bilaterally. No bony abnormality is noted. IMPRESSION: No acute abnormality noted. Electronically Signed   By: Inez Catalina M.D.   On: 12/30/2022 01:26    Procedures .Critical Care  Performed by: Ripley Fraise, MD Authorized by: Ripley Fraise, MD   Critical care provider statement:    Critical care time (minutes):  50   Critical care start time:  12/30/2022 2:10 AM   Critical care end time:  12/30/2022 3:00 AM   Critical care time was exclusive of:  Separately billable procedures and treating other patients   Critical care was necessary to treat or prevent imminent or life-threatening deterioration of the following conditions:  Circulatory failure, sepsis and shock   Critical care was time spent personally by me on the following activities:  Examination of patient, evaluation of patient's response to treatment, development of treatment plan with patient or surrogate, pulse oximetry, ordering and review of laboratory studies, ordering and review of radiographic studies and re-evaluation of patient's condition   I assumed direction of critical care for this patient from another provider in my specialty: no     Care discussed with: admitting provider       Medications Ordered in ED Medications  lactated ringers bolus 1,000 mL (1,000 mLs Intravenous New Bag/Given 12/30/22 0238)    And  lactated ringers bolus 500 mL (has no administration in time range)  metroNIDAZOLE (FLAGYL) IVPB 500 mg (500 mg Intravenous New Bag/Given 12/30/22 0313)  sodium chloride 0.9 % bolus 1,000 mL (0 mLs Intravenous Stopped 12/30/22 0236)  acetaminophen (TYLENOL) tablet 650 mg (650 mg Oral Given 12/30/22 0137)  cefTRIAXone (ROCEPHIN) 2 g in sodium chloride 0.9 % 100 mL IVPB (0 g Intravenous Stopped 12/30/22 0310)    ED Course/ Medical Decision Making/ A&P Clinical Course as of 12/30/22 0322  Tue Dec 30, 2022  0319 Patient history of dementia presenting with vomiting and some diarrhea.  Patient arrived febrile, but  overall vitals were appropriate.  He is awake and alert and appears at his mental baseline.  He had no focal abdominal tenderness.  Labs reveal dehydration as well as elevated lactate.  Code sepsis was called and IV fluids and antibiotics were ordered until cultures and other tests are resulted.  I discussed the case with his son.  Plan for admission. [DW]  0319 Called report to Dr. Bridgett Larsson with [facility] for admission [DW]    Clinical Course User Index [DW] Ripley Fraise, MD                             Medical Decision Making Amount and/or Complexity of Data Reviewed Labs: ordered. Radiology: ordered.  Risk OTC drugs. Prescription drug management. Decision regarding hospitalization.   This patient presents to the ED for concern of vomiting and diarrhea, this involves an extensive number of treatment options, and is a complaint that carries with  it a high risk of complications and morbidity.  The differential diagnosis includes but is not limited to gastroenteritis, ischemic colitis, diverticulitis  Comorbidities that complicate the patient evaluation: Patient's presentation is complicated by their history of dementia  Social Determinants of Health: Patient's  impaired memory and poor mobility   increases the complexity of managing their presentation  Additional history obtained: Additional history obtained from family Records reviewed Primary Care Documents  Lab Tests: I Ordered, and personally interpreted labs.  The pertinent results include: Dehydration, acute kidney injury  Imaging Studies ordered: I ordered imaging studies including X-ray chest   I independently visualized and interpreted imaging which showed no acute findings I agree with the radiologist interpretation  I considered CT imaging, patient is without any focal abdominal tenderness, no leukocytosis  Cardiac Monitoring: The patient was maintained on a cardiac monitor.  I personally viewed and interpreted the  cardiac monitor which showed an underlying rhythm of:  sinus rhythm  Medicines ordered and prescription drug management: I ordered medication including IV fluids and antibiotics for presumed sepsis Reevaluation of the patient after these medicines showed that the patient    improved   Critical Interventions:   IV fluids and antibiotics  Consultations Obtained: I requested consultation with the admitting physician [facility] , and discussed  findings as well as pertinent plan - they recommend: Admit  Reevaluation: After the interventions noted above, I reevaluated the patient and found that they have :improved  Complexity of problems addressed: Patient's presentation is most consistent with  acute presentation with potential threat to life or bodily function  Disposition: After consideration of the diagnostic results and the patient's response to treatment,  I feel that the patent would benefit from admission   .           Final Clinical Impression(s) / ED Diagnoses Final diagnoses:  Dehydration  Acute gastroenteritis    Rx / DC Orders ED Discharge Orders     None         Ripley Fraise, MD 12/30/22 [phone]

## 2022-12-30 NOTE — ED Triage Notes (Addendum)
Pt arrives via [facility] from home. Family called out for weakness/vomiting, hx of dementia, cannot hold his arms up, LSN 2030, incontinent of loose stool and urine. 2 episodes of vomiting for family. Denies pain. HR 95, 115/70, cbg 150, 91% room air, 97% 2 liters. 18g in the left forearm.

## 2022-12-30 NOTE — Assessment & Plan Note (Signed)
Stable. 

## 2022-12-30 NOTE — Care Management Obs Status (Cosign Needed)
[place] NOTIFICATION   Patient Details  Name: Jason TIEDT Sr. MRN: [id number] Date of Birth: 1931/02/03   Medicare Observation Status Notification Given:  Yes    Curlene Labrum, RN 12/30/2022, 1:14 PM

## 2022-12-30 NOTE — ED Notes (Signed)
ED TO INPATIENT HANDOFF REPORT  ED Nurse Name and Phone #: Waynetta Sandy [phone]  S Name/Age/Gender Jason Castaneda Sr. 89 y.o. male Room/Bed: 044C/044C  Code Status   Code Status: Full Code  Home/SNF/Other Home Patient oriented to: self and place Is this baseline? Yes   Triage Complete: Triage complete  Chief Complaint AKI (acute kidney injury) ([place]) [N17.9]  Triage Note Pt arrives via [facility] from home. Family called out for weakness/vomiting, hx of dementia, cannot hold his arms up, LSN 2030, incontinent of loose stool and urine. 2 episodes of vomiting for family. Denies pain. HR 95, 115/70, cbg 150, 91% room air, 97% 2 liters. 18g in the left forearm.    Allergies Allergies  Allergen Reactions   Donepezil     Level of Care/Admitting Diagnosis ED Disposition     ED Disposition  Admit   Condition  --   Comment  Hospital Area: [facility] [[id number]]  Level of Care: Med-Surg [16]  May place patient in observation at [facility] or [facility] if equivalent level of care is available:: No  Covid Evaluation: Asymptomatic - no recent exposure (last 10 days) testing not required  Diagnosis: AKI (acute kidney injury) [id number]  Admitting Physician: Bridgett Larsson, [place]  Attending Physician: Bridgett Larsson, ERIC [[id number]]          B Medical/Surgery History Past Medical History:  Diagnosis Date   Arthritis    hp bone,knees,hands,shoulders   BPH (benign prostatic hyperplasia)    BPH (benign prostatic hyperplasia)    Cataract    Dementia without behavioral disturbance ([place]) 12/30/2022   Diverticulosis    GERD (gastroesophageal reflux disease)    Glaucoma    History of stroke    Hyperlipemia    Hypertension    Loss of appetite    MCI (mild cognitive impairment)    Memory impairment    Sleep apnea    Past Surgical History:  Procedure Laterality Date   BUNIONECTOMY Left 05/16/2016   CATARACT EXTRACTION Bilateral 02/2013   TRANSURETHRAL RESECTION OF  PROSTATE  1997     A IV Location/Drains/Wounds Patient Lines/Drains/Airways Status     Active Line/Drains/Airways     Name Placement date Placement time Site Days   Peripheral IV 12/30/22 20 G Anterior;Proximal;Right Forearm 12/30/22  0128  Forearm  less than 1   Peripheral IV 12/30/22 20 G Anterior;Right Forearm 12/30/22  0128  Forearm  less than 1   Peripheral IV 12/30/22 18 G Anterior;Left;Proximal Forearm 12/30/22  0128  Forearm  less than 1   External Urinary Catheter 12/30/22  0313  --  less than 1            Intake/Output Last 24 hours  Intake/Output Summary (Last 24 hours) at 12/30/2022 0843 Last data filed at 12/30/2022 0518 Gross per 24 hour  Intake 103.33 ml  Output 300 ml  Net -196.67 ml    Labs/Imaging Results for orders placed or performed during the hospital encounter of 12/30/22 (from the past 48 hour(s))  Lipase, blood     Status: None   Collection Time: 12/30/22  1:22 AM  Result Value Ref Range   Lipase 35 11 - 51 U/L    Comment: Performed at [facility], [address], [place], [place] [address]  Comprehensive metabolic panel     Status: Abnormal   Collection Time: 12/30/22  1:22 AM  Result Value Ref Range   Sodium 141 135 - 145 mmol/L   Potassium 3.7  3.5 - 5.1 mmol/L   Chloride 106 98 - 111 mmol/L   CO2 26 22 - 32 mmol/L   Glucose, Bld 109 (H) 70 - 99 mg/dL    Comment: Glucose reference range applies only to samples taken after fasting for at least 8 hours.   BUN 14 8 - 23 mg/dL   Creatinine, Ser 1.34 (H) 0.61 - 1.24 mg/dL   Calcium 10.7 (H) 8.9 - 10.3 mg/dL   Total Protein 6.5 6.5 - 8.1 g/dL   Albumin 3.5 3.5 - 5.0 g/dL   AST 20 15 - 41 U/L   ALT 10 0 - 44 U/L   Alkaline Phosphatase 69 38 - 126 U/L   Total Bilirubin 0.8 0.3 - 1.2 mg/dL   GFR, Estimated 51 (L) >60 mL/min    Comment: (NOTE) Calculated using the CKD-EPI Creatinine Equation (2021)    Anion gap 9 5 - 15    Comment: Performed at [facility] [address]  [place], [place] [address]  CBC     Status: Abnormal   Collection Time: 12/30/22  1:22 AM  Result Value Ref Range   WBC 6.5 4.0 - 10.5 K/uL   RBC 3.89 (L) 4.22 - 5.81 MIL/uL   Hemoglobin 13.1 13.0 - 17.0 g/dL   HCT 37.3 (L) 39.0 - 52.0 %   MCV 95.9 80.0 - 100.0 fL   MCH 33.7 26.0 - 34.0 pg   MCHC 35.1 30.0 - 36.0 g/dL   RDW 13.1 11.5 - 15.5 %   Platelets 187 150 - 400 K/uL   nRBC 0.0 0.0 - 0.2 %    Comment: Performed at [facility], [place] [address], [place], [place] [address]  Lactic acid, plasma     Status: Abnormal   Collection Time: 12/30/22  1:22 AM  Result Value Ref Range   Lactic Acid, Venous 3.4 (HH) 0.5 - 1.9 mmol/L    Comment: CRITICAL RESULT CALLED TO, READ BACK BY AND VERIFIED WITH Clifton Custard, RN [phone], 12/30/22, EADEDOKUN Performed at [facility], [place] [address] [place], [place] [address]   Resp panel by RT-PCR (RSV, Flu A&B, Covid) Anterior Nasal Swab     Status: None   Collection Time: 12/30/22  1:22 AM   Specimen: Anterior Nasal Swab  Result Value Ref Range   SARS Coronavirus 2 by RT PCR NEGATIVE NEGATIVE   Influenza A by PCR NEGATIVE NEGATIVE   Influenza B by PCR NEGATIVE NEGATIVE    Comment: (NOTE) The Xpert Xpress SARS-CoV-2/FLU/RSV plus assay is intended as an aid in the diagnosis of influenza from Nasopharyngeal swab specimens and should not be used as a sole basis for treatment. Nasal washings and aspirates are unacceptable for Xpert Xpress SARS-CoV-2/FLU/RSV testing.  Fact Sheet for Patients: EntrepreneurPulse.com.au  Fact Sheet for Healthcare Providers: IncredibleEmployment.be  This test is not yet approved or cleared by the [place] FDA and has been authorized for detection and/or diagnosis of SARS-CoV-2 by FDA under an Emergency Use Authorization (EUA). This EUA will remain in effect (meaning this test can be used) for the duration of the COVID-19 declaration under Section 564(b)(1) of the Act, 21  U.S.C. section 360bbb-3(b)(1), unless the authorization is terminated or revoked.     Resp Syncytial Virus by PCR NEGATIVE NEGATIVE    Comment: (NOTE) Fact Sheet for Patients: EntrepreneurPulse.com.au  Fact Sheet for Healthcare Providers: IncredibleEmployment.be  This test is not yet approved or cleared by the [place] and has been authorized  for detection and/or diagnosis of SARS-CoV-2 by FDA under an Emergency Use Authorization (EUA). This EUA will remain in effect (meaning this test can be used) for the duration of the COVID-19 declaration under Section 564(b)(1) of the Act, 21 U.S.C. section 360bbb-3(b)(1), unless the authorization is terminated or revoked.  Performed at [facility], [place] [address] [place], [place] [address]   Lactic acid, plasma     Status: Abnormal   Collection Time: 12/30/22  3:17 AM  Result Value Ref Range   Lactic Acid, Venous 3.0 (HH) 0.5 - 1.9 mmol/L    Comment: CRITICAL VALUE NOTED. VALUE IS CONSISTENT WITH PREVIOUSLY REPORTED/CALLED VALUE Performed at [facility], [place] [address] [place], [place] [address]   Respiratory (~20 pathogens) panel by PCR     Status: None   Collection Time: 12/30/22  4:45 AM   Specimen: Nasopharyngeal Swab; Respiratory  Result Value Ref Range   Adenovirus NOT DETECTED NOT DETECTED   Coronavirus 229E NOT DETECTED NOT DETECTED    Comment: (NOTE) The Coronavirus on the Respiratory Panel, DOES NOT test for the novel  Coronavirus (2019 nCoV)    Coronavirus HKU1 NOT DETECTED NOT DETECTED   Coronavirus NL63 NOT DETECTED NOT DETECTED   Coronavirus OC43 NOT DETECTED NOT DETECTED   Metapneumovirus NOT DETECTED NOT DETECTED   Rhinovirus / Enterovirus NOT DETECTED NOT DETECTED   Influenza A NOT DETECTED NOT DETECTED   Influenza B NOT DETECTED NOT DETECTED   Parainfluenza Virus 1 NOT DETECTED NOT DETECTED   Parainfluenza Virus 2 NOT DETECTED NOT DETECTED    Parainfluenza Virus 3 NOT DETECTED NOT DETECTED   Parainfluenza Virus 4 NOT DETECTED NOT DETECTED   Respiratory Syncytial Virus NOT DETECTED NOT DETECTED   Bordetella pertussis NOT DETECTED NOT DETECTED   Bordetella Parapertussis NOT DETECTED NOT DETECTED   Chlamydophila pneumoniae NOT DETECTED NOT DETECTED   Mycoplasma pneumoniae NOT DETECTED NOT DETECTED    Comment: Performed at [facility], [place] [address], [place], [place] [address]  Urinalysis, Routine w reflex microscopic -Urine, Clean Catch     Status: Abnormal   Collection Time: 12/30/22  5:01 AM  Result Value Ref Range   Color, Urine YELLOW YELLOW   APPearance CLEAR CLEAR   Specific Gravity, Urine 1.009 1.005 - 1.030   pH 7.0 5.0 - 8.0   Glucose, UA NEGATIVE NEGATIVE mg/dL   Hgb urine dipstick MODERATE (A) NEGATIVE   Bilirubin Urine NEGATIVE NEGATIVE   Ketones, ur NEGATIVE NEGATIVE mg/dL   Protein, ur NEGATIVE NEGATIVE mg/dL   Nitrite NEGATIVE NEGATIVE   Leukocytes,Ua NEGATIVE NEGATIVE   RBC / HPF 21-50 0 - 5 RBC/hpf   WBC, UA 0-5 0 - 5 WBC/hpf   Bacteria, UA NONE SEEN NONE SEEN   Squamous Epithelial / HPF 0-5 0 - 5 /HPF   Mucus PRESENT    Hyaline Casts, UA PRESENT     Comment: Performed at [place] [facility] [address] [place], [place] [address]   DG Chest Port 1 View  Result Date: 12/30/2022 CLINICAL DATA:  Possible sepsis EXAM: PORTABLE CHEST 1 VIEW COMPARISON:  07/25/2022 FINDINGS: Cardiac shadow is stable. Aortic calcifications are seen. The lungs are well aerated bilaterally. No bony abnormality is noted. IMPRESSION: No acute abnormality noted. Electronically Signed   By: Inez Catalina M.D.   On: 12/30/2022 01:26    Pending Labs Unresulted Labs (From admission, onward)     Start     Ordered   12/30/22  0222  Gastrointestinal Panel by PCR , Stool  (Gastrointestinal Panel by PCR, Stool                                                                                                                                                      **Does Not include CLOSTRIDIUM DIFFICILE testing. **If CDIFF testing is needed, place order from the "C Difficile Testing" order set.**)  Once,   URGENT        12/30/22 0221   12/30/22 0222  C Difficile Quick Screen w PCR reflex  (C Difficile quick screen w PCR reflex panel )  Once, for 24 hours,   URGENT       References:    CDiff Information Tool   12/30/22 0221   12/30/22 0112  Blood Culture (routine x 2)  (Undifferentiated presentation (screening labs and basic nursing orders))  BLOOD CULTURE X 2,   STAT      12/30/22 0112            Vitals/Pain Today's Vitals   12/30/22 0715 12/30/22 0730 12/30/22 0800 12/30/22 0803  BP:  102/60 (!) 100/57   Pulse: 65 64 64   Resp: 20 (!) 21 (!) 22   Temp:    98.3 F (36.8 C)  TempSrc:    Oral  SpO2: 100% 100% 100%   Weight:      Height:      PainSc:        Isolation Precautions Droplet precaution  Medications Medications  sodium chloride 0.9 % bolus 1,000 mL (0 mLs Intravenous Stopped 12/30/22 0236)  acetaminophen (TYLENOL) tablet 650 mg (650 mg Oral Given 12/30/22 0137)  lactated ringers bolus 1,000 mL (0 mLs Intravenous Stopped 12/30/22 0344)    And  lactated ringers bolus 500 mL (0 mLs Intravenous Stopped 12/30/22 0359)  cefTRIAXone (ROCEPHIN) 2 g in sodium chloride 0.9 % 100 mL IVPB (0 g Intravenous Stopped 12/30/22 0310)  metroNIDAZOLE (FLAGYL) IVPB 500 mg (0 mg Intravenous Stopped 12/30/22 0413)    Mobility non-ambulatory     Focused Assessments Here for loose stool pt has had no episodes today Pt is alert and oriented to place and name   R Recommendations: See Admitting Provider Note  Report given to:   Additional Notes: .

## 2022-12-30 NOTE — H&P (Signed)
History and Physical    Jason Castaneda Sr. [id number] DOB: 15-Oct-1931 DOA: 12/30/2022  DOS: the patient was seen and examined on 12/30/2022  PCP: Ngetich, Nelda Bucks, NP   Patient coming from: Home  I have personally briefly reviewed patient's old medical records in [place]  CC: vomiting and weakness HPI: 89 year old African-American male history of prior stroke, dementia, hypertension presents to the ER today from home via EMS.  Reportedly had some weakness and some vomiting.  Family is no longer here in the ER.  Reportedly patient had 2 episodes of vomiting.  Patient was incontinent of stool.  On arrival temp 103, heart rate 88, blood pressure 124/67.  Labs showed a white count of 6.5, hemoglobin 13.1, platelets of 187  Sodium 141, BUN of 14, creatinine 1.3  2 weeks ago his BUN was 13 and creatinine 0.9  Checks x-ray was negative for pneumonia.  COVID-negative, influenza negative, RSV negative.  [facility] hospitalist contacted for admission.   ED Course: BUN 14, Scr 1.3, cxr negative  Review of Systems:  Review of Systems  Unable to perform ROS: Dementia    Past Medical History:  Diagnosis Date   Arthritis    hp bone,knees,hands,shoulders   BPH (benign prostatic hyperplasia)    BPH (benign prostatic hyperplasia)    Cataract    Dementia without behavioral disturbance ([place]) 12/30/2022   Diverticulosis    GERD (gastroesophageal reflux disease)    Glaucoma    History of stroke    Hyperlipemia    Hypertension    Loss of appetite    MCI (mild cognitive impairment)    Memory impairment    Sleep apnea     Past Surgical History:  Procedure Laterality Date   BUNIONECTOMY Left 05/16/2016   CATARACT EXTRACTION Bilateral 02/2013   TRANSURETHRAL RESECTION OF PROSTATE  1997     reports that he has never smoked. He has never used smokeless tobacco. He reports that he does not drink alcohol and does not use drugs.  Allergies  Allergen Reactions   Donepezil      History reviewed. No pertinent family history.  Prior to Admission medications   Medication Sig Start Date End Date Taking? Authorizing Provider  aspirin EC 81 MG tablet Take 81 mg by mouth daily. Swallow whole.   Yes [provider]  atorvastatin (LIPITOR) 40 MG tablet Take 1 tablet by mouth once daily 08/21/22  Yes Chima, Anderson Malta, MD  Cholecalciferol (D3 ADULT PO) Take 1 tablet by mouth daily.   Yes [provider]  esomeprazole (NEXIUM) 40 MG capsule Take 40 mg by mouth daily as needed (for acid reflux).   Yes [provider]  losartan (COZAAR) 50 MG tablet Take 50 mg by mouth daily.   Yes [provider]  memantine (NAMENDA) 5 MG tablet Take 1 tablet (5 mg total) by mouth 2 (two) times daily. 08/05/22  Yes Genia Harold, MD  Multiple Vitamins-Minerals (ONE DAILY COMPLETE FOR MEN) TABS Take 1 tablet by mouth daily.   Yes [provider]    Physical Exam: Vitals:   12/30/22 0100 12/30/22 0131 12/30/22 0315 12/30/22 0317  BP: 124/67  124/66   Pulse: 88  86   Resp: 18  20   Temp: (!) 103 F (39.4 C) (!) 102.1 F (38.9 C) 99.6 F (37.6 C)   TempSrc: Oral Rectal Oral   SpO2: 100%  97%   Weight:    59 kg  Height:    '5\' 6"'$  (1.676  m)    Physical Exam Vitals and nursing note reviewed.  Constitutional:      General: He is not in acute distress.    Appearance: He is not toxic-appearing or diaphoretic.     Comments: awake  HENT:     Head: Normocephalic and atraumatic.     Nose: Nose normal.  Eyes:     General: No scleral icterus. Cardiovascular:     Rate and Rhythm: Normal rate and regular rhythm.     Pulses: Normal pulses.  Pulmonary:     Effort: Pulmonary effort is normal.     Breath sounds: Normal breath sounds.  Abdominal:     General: Abdomen is flat. Bowel sounds are normal. There is no distension.     Tenderness: There is no abdominal tenderness.  Skin:    General: Skin is warm and dry.     Capillary Refill:  Capillary refill takes less than 2 seconds.     Comments: Tenting of skin  Neurological:     Mental Status: He is disoriented.      Labs on Admission: I have personally reviewed following labs and imaging studies  CBC: Recent Labs  Lab 12/30/22 0122  WBC 6.5  HGB 13.1  HCT 37.3*  MCV 95.9  PLT [id number]   Basic Metabolic Panel: Recent Labs  Lab 12/30/22 0122  NA 141  K 3.7  CL 106  CO2 26  GLUCOSE 109*  BUN 14  CREATININE 1.34*  CALCIUM 10.7*   GFR: Estimated Creatinine Clearance: 31.2 mL/min (A) (by C-G formula based on SCr of 1.34 mg/dL (H)). Liver Function Tests: Recent Labs  Lab 12/30/22 0122  AST 20  ALT 10  ALKPHOS 69  BILITOT 0.8  PROT 6.5  ALBUMIN 3.5   Recent Labs  Lab 12/30/22 0122  LIPASE 35    Urine analysis:    Component Value Date/Time   BILIRUBINUR small (A) 10/25/2021 0859   KETONESUR trace (5) (A) 10/25/2021 0859   PROTEINUR =100 (A) 10/25/2021 0859   UROBILINOGEN 0.2 10/25/2021 0859   NITRITE Negative 10/25/2021 0859   LEUKOCYTESUR Negative 10/25/2021 0859    Radiological Exams on Admission: I have personally reviewed images DG Chest Port 1 View  Result Date: 12/30/2022 CLINICAL DATA:  Possible sepsis EXAM: PORTABLE CHEST 1 VIEW COMPARISON:  07/25/2022 FINDINGS: Cardiac shadow is stable. Aortic calcifications are seen. The lungs are well aerated bilaterally. No bony abnormality is noted. IMPRESSION: No acute abnormality noted. Electronically Signed   By: Inez Catalina M.D.   On: 12/30/2022 01:26    EKG: My personal interpretation of EKG shows: NSR    Assessment/Plan Principal Problem:   AKI (acute kidney injury) ([place]) Active Problems:   Dehydration   Hypertension   Alzheimer's dementia without behavioral disturbance (H[facility])   GERD (gastroesophageal reflux disease)    Assessment and Plan: * AKI (acute kidney injury) ([place]) Observation med/surg bed. Continue with IVF. Check viral panel due to fever. Cxr negative. Still awaiting  UA. Given 1 dose of  IV ABX in ER with rocephin and flagyl. Will hold further abx until we know what we are treating.  Dehydration Continue with IVF.   Alzheimer's dementia without behavioral disturbance ([place]) Continue with namenda 5 mg bid.  Hypertension Stop losartan due to aki.  GERD (gastroesophageal reflux disease) Stable.   DVT prophylaxis: SQ Heparin Code Status: Full Code by default Family Communication: no family at bedside  Disposition Plan: return home Consults called: none  Admission status: Observation, Med-Surg  Kristopher Oppenheim, DO [facility] 12/30/2022, 4:57 AM

## 2022-12-30 NOTE — Evaluation (Signed)
Physical Therapy Evaluation Patient Details Name: Jason DONABEDIAN Sr. MRN: [id number] DOB: 29-Mar-1931 Today's Date: 12/30/2022  History of Present Illness  89 yo male [facility] with weakness, vomiting, workup for AKI and dehydration. PMH includes CVA, dementia, GERD, HLD, HTN.  Clinical Impression   Pt presents with generalized weakness, impaired balance requiring use of RW this date, impaired gait, and decreased activity tolerance vs baseline. Pt to benefit from acute PT to address deficits. Pt ambulated short room distance, overall requiring light physical assist to mobilize at this time. Per chart review and per pt, pt has 24/7 assist from family at d/c, recommend d/c home with HHPT and assist as needed. PT to progress mobility as tolerated, and will continue to follow acutely.         Recommendations for follow up therapy are one component of a multi-disciplinary discharge planning process, led by the attending physician.  Recommendations may be updated based on patient status, additional functional criteria and insurance authorization.  Follow Up Recommendations Home health PT      Assistance Recommended at Discharge Frequent or constant Supervision/Assistance  Patient can return home with the following  A little help with walking and/or transfers;A little help with bathing/dressing/bathroom    Equipment Recommendations Rolling walker (2 wheels)  Recommendations for Other Services       Functional Status Assessment Patient has had a recent decline in their functional status and demonstrates the ability to make significant improvements in function in a reasonable and predictable amount of time.     Precautions / Restrictions Precautions Precautions: Fall Restrictions Weight Bearing Restrictions: No      Mobility  Bed Mobility Overal bed mobility: Needs Assistance Bed Mobility: Supine to Sit, Sit to Supine     Supine to sit: Min assist Sit to supine: Min  assist   General bed mobility comments: assist for trunk elevation from supine>sit and boost up in bed upon return to supine.    Transfers Overall transfer level: Needs assistance Equipment used: Rolling walker (2 wheels) Transfers: Sit to/from Stand Sit to Stand: Min assist           General transfer comment: assist to rise and steady, x1 uncontrolled return to sit when pt initially stood given difficulty achieving balance, RW placed in front of pt helped pt self-steady on second stand attempt.    Ambulation/Gait Ambulation/Gait assistance: Min assist Gait Distance (Feet): 20 Feet Assistive device: Rolling walker (2 wheels) Gait Pattern/deviations: Step-through pattern, Decreased stride length, Trunk flexed Gait velocity: decr     General Gait Details: assist to steady, cues for placement in RW and upright posture.  Stairs            Wheelchair Mobility    Modified Rankin (Stroke Patients Only)       Balance Overall balance assessment: Needs assistance Sitting-balance support: No upper extremity supported, Feet supported Sitting balance-Leahy Scale: Fair     Standing balance support: Bilateral upper extremity supported, During functional activity Standing balance-Leahy Scale: Poor Standing balance comment: reliant on RW                             Pertinent Vitals/Pain Pain Assessment Pain Assessment: No/denies pain    Home Living Family/patient expects to be discharged to:: Private residence Living Arrangements: Children Available Help at Discharge: Family Type of Home: House Home Access: Stairs to enter   [employer] of Steps: "a few short  ones"   Home Layout: One level Home Equipment: None      Prior Function Prior Level of Function : Needs assist               ADLs Comments: pt reports his son does the cooking, cleaning, grocery shopping. Pt reports independence with basic ADLs     Hand Dominance   Dominant  Hand: Right    Extremity/Trunk Assessment   Upper Extremity Assessment Upper Extremity Assessment: Defer to OT evaluation    Lower Extremity Assessment Lower Extremity Assessment: Generalized weakness    Cervical / Trunk Assessment Cervical / Trunk Assessment: Kyphotic  Communication   Communication: No difficulties  Cognition Arousal/Alertness: Awake/alert Behavior During Therapy: WFL for tasks assessed/performed Overall Cognitive Status: History of cognitive impairments - at baseline                                 General Comments: history of dementia, pt A&Ox4 pt requires repeated one-step commands for mobility. Pt also endorses being tired, states "I'm just lazy"        General Comments General comments (skin integrity, edema, etc.): vss    Exercises     Assessment/Plan    PT Assessment Patient needs continued PT services  PT Problem List Decreased strength;Decreased balance;Decreased activity tolerance;Decreased coordination;Decreased mobility       PT Treatment Interventions DME instruction;Functional mobility training;Therapeutic activities;Neuromuscular re-education;Gait training;Stair training;Balance training    PT Goals (Current goals can be found in the Care Plan section)  Acute Rehab PT Goals PT Goal Formulation: With patient Time For Goal Achievement: 01/13/23 Potential to Achieve Goals: Good    Frequency Min 3X/week     Co-evaluation               AM-PAC PT "6 Clicks" Mobility  Outcome Measure Help needed turning from your back to your side while in a flat bed without using bedrails?: A Little Help needed moving from lying on your back to sitting on the side of a flat bed without using bedrails?: A Little Help needed moving to and from a bed to a chair (including a wheelchair)?: A Little Help needed standing up from a chair using your arms (e.g., wheelchair or bedside chair)?: A Little Help needed to walk in hospital room?:  A Little Help needed climbing 3-5 steps with a railing? : A Little 6 Click Score: 18    End of Session   Activity Tolerance: Patient limited by pain Patient left: with call bell/phone within reach;with bed alarm set;in bed Nurse Communication: Mobility status PT Visit Diagnosis: Other abnormalities of gait and mobility (R26.89);Muscle weakness (generalized) (M62.81)    Time: [id number] PT Time Calculation (min) (ACUTE ONLY): 19 min   Charges:   PT Evaluation $PT Eval Low Complexity: 1 Low          Josyah Achor S, PT DPT Acute Rehabilitation Services Pager [phone]  Office [phone]   Louis Matte 12/30/2022, 4:37 PM

## 2022-12-30 NOTE — Assessment & Plan Note (Signed)
Stop losartan due to aki.

## 2022-12-30 NOTE — Progress Notes (Signed)
  Progress Note   Patient: Jason MELOY Sr. [id number] DOB: February 15, 1931 DOA: 12/30/2022     0 DOS: the patient was seen and examined on 12/30/2022   Brief hospital course: 89 year old man PMH including dementia presented from home with weakness and vomiting.  Admitted for dehydration, AKI.  Noted to have 1 episode of fever.  Viral gastroenteritis considered.  Assessment and Plan: * AKI (acute kidney injury) ([place]) Baseline around 0.9. IV fluids, trend BMP.  Check GI pathogen panel and C. difficile if further stools.  No urinary symptoms.    Fever Etiology, significance unclear.  Flu, COVID, RSV negative Chest x-ray negative.  Urinalysis negative. Monitor off antibiotics.  Dehydration Continue with IVF.   Alzheimer's dementia without behavioral disturbance ([place]) Continue with namenda 5 mg bid.  Essential hypertension Stop losartan due to aki.      Subjective:  Feels ok No n/v/d No pain No complaints  Physical Exam: Vitals:   12/30/22 0715 12/30/22 0730 12/30/22 0800 12/30/22 0803  BP:  102/60 (!) 100/57   Pulse: 65 64 64   Resp: 20 (!) 21 (!) 22   Temp:    98.3 F (36.8 C)  TempSrc:    Oral  SpO2: 100% 100% 100%   Weight:      Height:       Physical Exam Vitals reviewed.  Constitutional:      General: He is not in acute distress.    Appearance: He is not ill-appearing or toxic-appearing.  Cardiovascular:     Rate and Rhythm: Normal rate and regular rhythm.     Heart sounds: No murmur heard. Pulmonary:     Effort: Pulmonary effort is normal. No respiratory distress.     Breath sounds: No wheezing, rhonchi or rales.  Abdominal:     General: There is no distension.     Palpations: Abdomen is soft.     Tenderness: There is no abdominal tenderness. There is no guarding.  Neurological:     Mental Status: He is alert.  Psychiatric:        Mood and Affect: Mood normal.        Behavior: Behavior normal.     Data Reviewed: Creatinine 1.34 3/5 Lactic  acid 3.4 > 3.0  Family Communication:   Disposition: Status is: Observation   Planned Discharge Destination: Home    Time spent: 20 minutes  Author: Murray Hodgkins, MD 12/30/2022 9:50 AM  For on call review [email or web address].

## 2022-12-30 NOTE — Assessment & Plan Note (Signed)
Continue with namenda 5 mg bid.

## 2022-12-31 DIAGNOSIS — N179 Acute kidney failure, unspecified: Secondary | ICD-10-CM | POA: Diagnosis not present

## 2022-12-31 LAB — BASIC METABOLIC PANEL
Anion gap: 6 (ref 5–15)
BUN: 10 mg/dL (ref 8–23)
CO2: 25 mmol/L (ref 22–32)
Calcium: 10 mg/dL (ref 8.9–10.3)
Chloride: 106 mmol/L (ref 98–111)
Creatinine, Ser: 0.96 mg/dL (ref 0.61–1.24)
GFR, Estimated: >60 mL/min (ref >60)
Glucose, Bld: 89 mg/dL (ref 70–99)
Potassium: 3.9 mmol/L (ref 3.5–5.1)
Sodium: 137 mmol/L (ref 135–145)

## 2022-12-31 MED ORDER — ESOMEPRAZOLE MAGNESIUM 20 MG PO CPDR: 20.0000 mg | DELAYED_RELEASE_CAPSULE | Freq: Every day | ORAL | Status: DC | PRN

## 2022-12-31 NOTE — Progress Notes (Signed)
Mobility Specialist - Progress Note   12/31/22 1149  Mobility  Activity Ambulated with assistance in hallway  Level of Assistance Minimal assist, patient does 75% or more  Assistive Device Front wheel walker  Distance Ambulated (ft) 80 ft  Activity Response Tolerated well  Mobility Referral Yes  $Mobility charge 1 Mobility   Pt was received in bed and agreeable to mobility. No complaints throughout session. Pt was returned to bed with all needs met and bed alarm on.  Franki Monte  Mobility Specialist Please contact via Solicitor or Rehab office at [phone]

## 2022-12-31 NOTE — Discharge Summary (Signed)
Physician Discharge Summary  Jason MCRIGHT Sr. [id number] DOB: 31-Oct-1930 DOA: 12/30/2022  PCP: Sandrea Hughs, NP  Admit date: 12/30/2022 Discharge date: 12/31/2022  Time spent: 40 minutes  Recommendations for Outpatient Follow-up:  Follow outpatient CBC/CMP  Attention to renal function with resumption of losartan  Discharge Diagnoses:  Principal Problem:   AKI (acute kidney injury) ([place]) Active Problems:   Dehydration   Hypertension   Alzheimer's dementia without behavioral disturbance ([place])   GERD (gastroesophageal reflux disease)   Discharge Condition: stable  Diet recommendation: follow outpatient CBC/CMP  Filed Weights   12/30/22 0317 12/31/22 0500  Weight: 59 kg 53.4 kg    History of present illness:  89 yo with hx dementia who presented with fevers, nausea, vomiting and AKI.  Suspected to have viral gastroenteritis.  Improved on 3/6 and stable for discharge.  See below for additional details  Hospital Course:  Assessment and Plan: AKI (acute kidney injury) ([place]) Due to nausea, vomiting, acute illness Back to baseline at time of discharge Ok to resume losartan at discharge, needs repeat labs within 1 week   Fever Suspected viral gastroenteritis (nausea, vomiting, fever - also episode of loose stool or diarrhea at home) Negative RVP, negative covid, RSV, flu Chest x-ray negative.  Urinalysis not concerning for UTI. Monitor off antibiotics.   Dehydration Continue with IVF.    Alzheimer's dementia without behavioral disturbance ([place]) Continue with namenda 5 mg bid.   Essential hypertension Resume losartan at discharge    Procedures: none   Consultations: none  Discharge Exam: Vitals:   12/31/22 0504 12/31/22 0736  BP: (!) 144/69 (!) 143/83  Pulse: 64 (!) 58  Resp: 17 16  Temp: 98.5 F (36.9 C) 98.3 F (36.8 C)  SpO2: 100% 93%   No complaints Feels better  General: No acute distress. Seen walking with therapy earlier, then assessed  in bed. Cardiovascular: Heart sounds show Jason Castaneda regular rate, and rhythm. No gallops or rubs. No murmurs. No JVD. Lungs: Clear to auscultation bilaterally with good air movement. No rales, rhonchi or wheezes. Abdomen: Soft, nontender, nondistended with normal active bowel sounds. No masses. No hepatosplenomegaly. Neurological: Alert and oriented 3. Moves all extremities 4 with equal strength. Cranial nerves II through XII grossly intact. Extremities: No clubbing or cyanosis. No edema.  Discharge Instructions   Discharge Instructions     Call MD for:  difficulty breathing, headache or visual disturbances   Complete by: As directed    Call MD for:  extreme fatigue   Complete by: As directed    Call MD for:  hives   Complete by: As directed    Call MD for:  persistant dizziness or light-headedness   Complete by: As directed    Call MD for:  persistant nausea and vomiting   Complete by: As directed    Call MD for:  redness, tenderness, or signs of infection (pain, swelling, redness, odor or green/yellow discharge around incision site)   Complete by: As directed    Call MD for:  severe uncontrolled pain   Complete by: As directed    Call MD for:  temperature >100.4   Complete by: As directed    Diet - low sodium heart healthy   Complete by: As directed    Discharge instructions   Complete by: As directed    You were seen for fever, nausea, vomiting, and acute kidney injury.  The cause of your underlying symptoms is unclear, but fever + nausea and vomiting is consistent  with Jason Castaneda possible viral gastroenteritis.    Your fever has resolved.  Your kidney function has returned to normal.  You're tolerating Jason Castaneda diet.  We'll send you home with plans for outpatient follow up.  Resume your home losartan for blood pressure.  Repeat labs within 7 days to ensure your kidney function remains stable.  We'll send you home with home health services.  Follow with your PCP as an outpatient to review this  hospitalization.  Return for new, recurrent, or worsening symptoms.  Please ask your PCP to request records from this hospitalization so they know what was done and what the next steps will be.   Increase activity slowly   Complete by: As directed       Allergies as of 12/31/2022       Reactions   Donepezil         Medication List     TAKE these medications    aspirin EC 81 MG tablet Take 81 mg by mouth daily. Swallow whole.   atorvastatin 40 MG tablet Commonly known as: LIPITOR Take 1 tablet by mouth once daily   D3 ADULT PO Take 1 tablet by mouth daily.   esomeprazole 20 MG capsule Commonly known as: NEXIUM Take 1 capsule (20 mg total) by mouth daily as needed (for acid reflux). What changed:  medication strength how much to take   losartan 50 MG tablet Commonly known as: COZAAR Take 50 mg by mouth daily.   memantine 5 MG tablet Commonly known as: Namenda Take 1 tablet (5 mg total) by mouth 2 (two) times daily.   One Daily Complete for Men Tabs Take 1 tablet by mouth daily.               Durable Medical Equipment  (From admission, onward)           Start     Ordered   12/31/22 0957  DME Gilford Rile  Once       Question Answer Comment  Walker: With 5 Inch Wheels   Patient needs Jason Castaneda walker to treat with the following condition Physical deconditioning      12/31/22 0957           Allergies  Allergen Reactions   Donepezil       The results of significant diagnostics from this hospitalization (including imaging, microbiology, ancillary and laboratory) are listed below for reference.    Significant Diagnostic Studies: DG Chest Port 1 View  Result Date: 12/30/2022 CLINICAL DATA:  Possible sepsis EXAM: PORTABLE CHEST 1 VIEW COMPARISON:  07/25/2022 FINDINGS: Cardiac shadow is stable. Aortic calcifications are seen. The lungs are well aerated bilaterally. No bony abnormality is noted. IMPRESSION: No acute abnormality noted. Electronically  Signed   By: Inez Catalina M.D.   On: 12/30/2022 01:26    Microbiology: Recent Results (from the past 240 hour(s))  Blood Culture (routine x 2)     Status: None (Preliminary result)   Collection Time: 12/30/22  1:22 AM   Specimen: BLOOD  Result Value Ref Range Status   Specimen Description BLOOD RIGHT ANTECUBITAL  Final   Special Requests   Final    BOTTLES DRAWN AEROBIC AND ANAEROBIC Blood Culture adequate volume   Culture   Final    NO GROWTH 1 DAY Performed at [facility], [address], [place], [place] [address]    Report Status PENDING  Incomplete  Blood Culture (routine x 2)     Status: None (Preliminary result)  Collection Time: 12/30/22  1:22 AM   Specimen: BLOOD RIGHT FOREARM  Result Value Ref Range Status   Specimen Description BLOOD RIGHT FOREARM  Final   Special Requests   Final    BOTTLES DRAWN AEROBIC AND ANAEROBIC Blood Culture adequate volume   Culture   Final    NO GROWTH 1 DAY Performed at [facility], [place] [address], [place], [place] [address]    Report Status PENDING  Incomplete  Resp panel by RT-PCR (RSV, Flu [place] Subramaniam&B, Covid) Anterior Nasal Swab     Status: None   Collection Time: 12/30/22  1:22 AM   Specimen: Anterior Nasal Swab  Result Value Ref Range Status   SARS Coronavirus 2 by RT PCR NEGATIVE NEGATIVE Final   Influenza Jason Castaneda by PCR NEGATIVE NEGATIVE Final   Influenza B by PCR NEGATIVE NEGATIVE Final    Comment: (NOTE) The Xpert Xpress SARS-CoV-2/FLU/RSV plus assay is intended as an aid in the diagnosis of influenza from Nasopharyngeal swab specimens and should not be used as Cashlyn Huguley sole basis for treatment. Nasal washings and aspirates are unacceptable for Xpert Xpress SARS-CoV-2/FLU/RSV testing.  Fact Sheet for Patients: EntrepreneurPulse.com.au  Fact Sheet for Healthcare Providers: IncredibleEmployment.be  This test is not yet approved or cleared by the [place] FDA and has been authorized for  detection and/or diagnosis of SARS-CoV-2 by FDA under an Emergency Use Authorization (EUA). This EUA will remain in effect (meaning this test can be used) for the duration of the COVID-19 declaration under Section 564(b)(1) of the Act, 21 U.S.C. section 360bbb-3(b)(1), unless the authorization is terminated or revoked.     Resp Syncytial Virus by PCR NEGATIVE NEGATIVE Final    Comment: (NOTE) Fact Sheet for Patients: EntrepreneurPulse.com.au  Fact Sheet for Healthcare Providers: IncredibleEmployment.be  This test is not yet approved or cleared by the [place] FDA and has been authorized for detection and/or diagnosis of SARS-CoV-2 by FDA under an Emergency Use Authorization (EUA). This EUA will remain in effect (meaning this test can be used) for the duration of the COVID-19 declaration under Section 564(b)(1) of the Act, 21 U.S.C. section 360bbb-3(b)(1), unless the authorization is terminated or revoked.  Performed at [facility], [place] [address] [place], [place] [address]   Respiratory (~20 pathogens) panel by PCR     Status: None   Collection Time: 12/30/22  4:45 AM   Specimen: Nasopharyngeal Swab; Respiratory  Result Value Ref Range Status   Adenovirus NOT DETECTED NOT DETECTED Final   Coronavirus 229E NOT DETECTED NOT DETECTED Final    Comment: (NOTE) The Coronavirus on the Respiratory Panel, DOES NOT test for the novel  Coronavirus (2019 nCoV)    Coronavirus HKU1 NOT DETECTED NOT DETECTED Final   Coronavirus NL63 NOT DETECTED NOT DETECTED Final   Coronavirus OC43 NOT DETECTED NOT DETECTED Final   Metapneumovirus NOT DETECTED NOT DETECTED Final   Rhinovirus / Enterovirus NOT DETECTED NOT DETECTED Final   Influenza Jason Castaneda NOT DETECTED NOT DETECTED Final   Influenza B NOT DETECTED NOT DETECTED Final   Parainfluenza Virus 1 NOT DETECTED NOT DETECTED Final   Parainfluenza Virus 2 NOT DETECTED NOT DETECTED Final   Parainfluenza  Virus 3 NOT DETECTED NOT DETECTED Final   Parainfluenza Virus 4 NOT DETECTED NOT DETECTED Final   Respiratory Syncytial Virus NOT DETECTED NOT DETECTED Final   Bordetella pertussis NOT DETECTED NOT DETECTED Final   Bordetella Parapertussis NOT DETECTED NOT DETECTED Final   Chlamydophila pneumoniae NOT DETECTED NOT DETECTED  Final   Mycoplasma pneumoniae NOT DETECTED NOT DETECTED Final    Comment: Performed at [facility], [place] [address] [place], [place] [address]     Labs: Basic Metabolic Panel: Recent Labs  Lab 12/30/22 0122 12/31/22 0747  NA 141 137  K 3.7 3.9  CL 106 106  CO2 26 25  GLUCOSE 109* 89  BUN 14 10  CREATININE 1.34* 0.96  CALCIUM 10.7* 10.0   Liver Function Tests: Recent Labs  Lab 12/30/22 0122  AST 20  ALT 10  ALKPHOS 69  BILITOT 0.8  PROT 6.5  ALBUMIN 3.5   Recent Labs  Lab 12/30/22 0122  LIPASE 35   No results for input(s): "AMMONIA" in the last 168 hours. CBC: Recent Labs  Lab 12/30/22 0122  WBC 6.5  HGB 13.1  HCT 37.3*  MCV 95.9  PLT 187   Cardiac Enzymes: No results for input(s): "CKTOTAL", "CKMB", "CKMBINDEX", "TROPONINI" in the last 168 hours. BNP: BNP (last 3 results) No results for input(s): "BNP" in the last 8760 hours.  ProBNP (last 3 results) No results for input(s): "PROBNP" in the last 8760 hours.  CBG: No results for input(s): "GLUCAP" in the last 168 hours.     Signed:  Fayrene Helper MD.  [facility] 12/31/2022, 10:00 AM

## 2022-12-31 NOTE — Progress Notes (Signed)
Physical Therapy Treatment Patient Details Name: Jason Castaneda. MRN: [id number] DOB: [birth year] Today's Date: 12/31/2022   History of Present Illness 89 yo male [facility] with weakness, vomiting, workup for AKI and dehydration. PMH includes CVA, dementia, GERD, HLD, HTN.    PT Comments    Pt endorses "feeling lazy" again today, but agreeable to mobility progression as pt may d/c home soon. Pt with improved tolerance for activity, ambulating hallway distance with RW. Balance remains impaired, pt benefits from use of RW at this time. Plan remains appropriate, PT to continue to follow while acute.      Recommendations for follow up therapy are one component of a multi-disciplinary discharge planning process, led by the attending physician.  Recommendations may be updated based on patient status, additional functional criteria and insurance authorization.  Follow Up Recommendations  Home health PT     Assistance Recommended at Discharge Frequent or constant Supervision/Assistance  Patient can return home with the following A little help with walking and/or transfers;A little help with bathing/dressing/bathroom   Equipment Recommendations  Rolling walker (2 wheels)    Recommendations for Other Services       Precautions / Restrictions Precautions Precautions: Fall Restrictions Weight Bearing Restrictions: No     Mobility  Bed Mobility Overal bed mobility: Needs Assistance Bed Mobility: Supine to Sit, Sit to Supine     Supine to sit: Min assist Sit to supine: Min assist   General bed mobility comments: assist for trunk elevation from supine>sit    Transfers Overall transfer level: Needs assistance Equipment used: Rolling walker (2 wheels) Transfers: Sit to/from Stand Sit to Stand: Min assist           General transfer comment: light steadying assist once standing, use of RW for pt to self-steady as well    Ambulation/Gait Ambulation/Gait  assistance: Min guard Gait Distance (Feet): 85 Feet Assistive device: Rolling walker (2 wheels) Gait Pattern/deviations: Step-through pattern, Decreased stride length, Trunk flexed Gait velocity: decr     General Gait Details: cues for upright posture, hallway navigation, RW use   Stairs             Wheelchair Mobility    Modified Rankin (Stroke Patients Only)       Balance Overall balance assessment: Needs assistance Sitting-balance support: No upper extremity supported, Feet supported Sitting balance-Leahy Scale: Fair     Standing balance support: Bilateral upper extremity supported, During functional activity Standing balance-Leahy Scale: Poor Standing balance comment: reliant on RW                            Cognition Arousal/Alertness: Awake/alert Behavior During Therapy: WFL for tasks assessed/performed Overall Cognitive Status: History of cognitive impairments - at baseline                                 General Comments: history of dementia, short term memory deficits this date (pt initiates mobility to EOB to prepare for OOB, then stops and says "will you cover my legs?" and lays down)        Exercises      General Comments        Pertinent Vitals/Pain Pain Assessment Pain Assessment: No/denies pain    Home Living  Prior Function            PT Goals (current goals can now be found in the care plan section) Acute Rehab PT Goals PT Goal Formulation: With patient Time For Goal Achievement: 01/13/23 Potential to Achieve Goals: Good Progress towards PT goals: Progressing toward goals    Frequency    Min 3X/week      PT Plan Current plan remains appropriate    Co-evaluation              AM-PAC PT "6 Clicks" Mobility   Outcome Measure  Help needed turning from your back to your side while in a flat bed without using bedrails?: A Little Help needed moving from  lying on your back to sitting on the side of a flat bed without using bedrails?: A Little Help needed moving to and from a bed to a chair (including a wheelchair)?: A Little Help needed standing up from a chair using your arms (e.g., wheelchair or bedside chair)?: A Little Help needed to walk in hospital room?: A Little Help needed climbing 3-5 steps with a railing? : A Little 6 Click Score: 18    End of Session   Activity Tolerance: Patient limited by pain Patient left: with call bell/phone within reach;with bed alarm set;in bed Nurse Communication: Mobility status PT Visit Diagnosis: Other abnormalities of gait and mobility (R26.89);Muscle weakness (generalized) (M62.81)     Time: [id number] PT Time Calculation (min) (ACUTE ONLY): 16 min  Charges:  $Therapeutic Activity: 8-22 mins                     Stacie Glaze, PT DPT Acute Rehabilitation Services Pager [phone]  Office [phone]    Dora E Ruffin Pyo 12/31/2022, 10:13 AM

## 2022-12-31 NOTE — TOC Progression Note (Signed)
Transition of Care (TOC) - Progression Note    Patient Details  Name: TORRENCE MCNEAR Sr. MRN: [id number] Date of Birth: 1931-05-07  Transition of [facility]) CM/SW Contact  Curlene Labrum, RN Phone Number: 12/31/2022, 10:13 AM  Clinical Narrative:    CM called and spoke with the patient's son, Mliss Fritz by phone and offered Medicare choice regarding home health services and he did not have a preference.  I called [facility] agency and Amy Hyatt, RNCM accepted him for home health services starting tomorrow for PT.  Home health orders placed.  I called the patient's son and patient will need a RW.  Choice was offered for DME company and he did not have a preference.  I called [employer] and RW will be delivered to the patient's hospital room this afternoon.  DME order placed and son is aware.  The patient's son will be providing transportation to home by car when the patient is medically stable for discharge.   Expected Discharge Plan: Home/Self Care Barriers to Discharge: Continued Medical Work up  Expected Discharge Plan and Services   Discharge Planning Services: CM Consult   Living arrangements for the past 2 months: Single Family Home Expected Discharge Date: 12/31/22                                     Social Determinants of Health (SDOH) Interventions SDOH Screenings   Food Insecurity: No Food Insecurity (12/30/2022)  Housing: Low Risk  (12/30/2022)  Transportation Needs: No Transportation Needs (12/30/2022)  Utilities: Not At Risk (12/30/2022)  Depression (PHQ2-9): Low Risk  (12/10/2022)  Tobacco Use: Low Risk  (12/30/2022)    Readmission Risk Interventions     No data to display

## 2022-12-31 NOTE — Evaluation (Signed)
Occupational Therapy Evaluation and Discharge Patient Details Name: Jason SLYMAN Sr. MRN: [id number] DOB: 06/04/1931 Today's Date: 12/31/2022   History of Present Illness 89 yo male presents to [facility] on 3/5 with weakness, vomiting, workup for AKI and dehydration. PMH includes CVA, dementia, GERD, HLD, HTN.   Clinical Impression   Pt typically walks without AD and is independent in self care. He relies on his son for IADLs. He lives with his son who works from home. Pt presents with impaired cognition, likely baseline and generalized weakness. We started the session with use of RW, but pt pushed it aside and walked with min guard assist to the bathroom/sink and back to EOB where he dressed with set up to supervision. Recommending home with close supervision of family.      Recommendations for follow up therapy are one component of a multi-disciplinary discharge planning process, led by the attending physician.  Recommendations may be updated based on patient status, additional functional criteria and insurance authorization.   Follow Up Recommendations  No OT follow up     Assistance Recommended at Discharge Frequent or constant Supervision/Assistance  Patient can return home with the following A little help with walking and/or transfers;A little help with bathing/dressing/bathroom;Assistance with cooking/housework;Direct supervision/assist for medications management;Direct supervision/assist for financial management;Assist for transportation;Help with stairs or ramp for entrance    Functional Status Assessment  Patient has had a recent decline in their functional status and demonstrates the ability to make significant improvements in function in a reasonable and predictable amount of time.  Equipment Recommendations  None recommended by OT    Recommendations for Other Services       Precautions / Restrictions Precautions Precautions: Fall Restrictions Weight Bearing Restrictions:  No      Mobility Bed Mobility Overal bed mobility: Modified Independent             General bed mobility comments: increased time, no assist    Transfers Overall transfer level: Needs assistance Equipment used: None (started with RW, pt pushed it aside, does not typically use) Transfers: Sit to/from Stand Sit to Stand: Supervision           General transfer comment: no assist from bed during dressing or from toilet      Balance Overall balance assessment: Needs assistance   Sitting balance-Leahy Scale: Good       Standing balance-Leahy Scale: Fair                             ADL either performed or assessed with clinical judgement   ADL Overall ADL's : Needs assistance/impaired Eating/Feeding: Set up;Bed level   Grooming: Wash/dry hands;Standing;Supervision/safety   Upper Body Bathing: Supervision/ safety;Sitting   Lower Body Bathing: Sit to/from stand;Supervison/ safety   Upper Body Dressing : Set up;Sitting   Lower Body Dressing: Supervision/safety;Sit to/from stand   Toilet Transfer: Min guard;Ambulation   Toileting- Clothing Manipulation and Hygiene: Modified independent;Sitting/lateral lean       Functional mobility during ADLs: Min guard       Vision Ability to See in Adequate Light: 0 Adequate Patient Visual Report: No change from baseline       Perception     Praxis      Pertinent Vitals/Pain Pain Assessment Pain Assessment: No/denies pain     Hand Dominance Right   Extremity/Trunk Assessment Upper Extremity Assessment Upper Extremity Assessment: Overall WFL for tasks assessed   Lower Extremity Assessment Lower Extremity  Assessment: Defer to PT evaluation   Cervical / Trunk Assessment Cervical / Trunk Assessment: Kyphotic   Communication Communication Communication: No difficulties   Cognition Arousal/Alertness: Awake/alert Behavior During Therapy: WFL for tasks assessed/performed Overall Cognitive  Status: History of cognitive impairments - at baseline                                       General Comments       Exercises     Shoulder Instructions      Home Living Family/patient expects to be discharged to:: Private residence Living Arrangements: Children Available Help at Discharge: Family;Available 24 hours/day (son works from home) Type of Home: House Home Access: Stairs to enter [employer] of Steps: "a few short ones"   Home Layout: One level     Bathroom Shower/Tub: Walk-in shower;Tub/shower unit   Bathroom Toilet: Standard     Home Equipment: None          Prior Functioning/Environment Prior Level of Function : Needs assist               ADLs Comments: pt reports his son does the cooking, cleaning, grocery shopping. Pt reports independence with basic ADLs        OT Problem List:        OT Treatment/Interventions:      OT Goals(Current goals can be found in the care plan section)    OT Frequency:      Co-evaluation              AM-PAC OT "6 Clicks" Daily Activity     Outcome Measure Help from another person eating meals?: None Help from another person taking care of personal grooming?: A Little Help from another person toileting, which includes using toliet, bedpan, or urinal?: A Little Help from another person bathing (including washing, rinsing, drying)?: A Little Help from another person to put on and taking off regular upper body clothing?: A Little Help from another person to put on and taking off regular lower body clothing?: A Little 6 Click Score: 19   End of Session Equipment Utilized During Treatment: Gait belt  Activity Tolerance: Patient tolerated treatment well Patient left: in bed;with call bell/phone within reach;with bed alarm set  OT Visit Diagnosis: Unsteadiness on feet (R26.81);Other symptoms and signs involving cognitive function                Time: 1025-1046 OT Time Calculation  (min): 21 min Charges:  OT General Charges $OT Visit: 1 Visit OT Evaluation $OT Eval Moderate Complexity: [place], OTR/L Acute Rehabilitation Services Office: [phone]   Malka So 12/31/2022, 11:47 AM

## 2023-01-02 ENCOUNTER — Encounter: Payer: Self-pay | Admitting: Family

## 2023-01-02 ENCOUNTER — Other Ambulatory Visit: Payer: Self-pay | Admitting: Family

## 2023-01-02 ENCOUNTER — Ambulatory Visit (INDEPENDENT_AMBULATORY_CARE_PROVIDER_SITE_OTHER): Payer: PPO | Admitting: Family

## 2023-01-02 VITALS — BP 120/82 | HR 56 | Temp 98.2°F | Resp 18 | Ht 66.0 in | Wt 119.4 lb

## 2023-01-02 DIAGNOSIS — E785 Hyperlipidemia, unspecified: Secondary | ICD-10-CM

## 2023-01-02 DIAGNOSIS — I10 Essential (primary) hypertension: Secondary | ICD-10-CM

## 2023-01-02 DIAGNOSIS — K219 Gastro-esophageal reflux disease without esophagitis: Secondary | ICD-10-CM

## 2023-01-02 DIAGNOSIS — F0280 Dementia in other diseases classified elsewhere without behavioral disturbance: Secondary | ICD-10-CM

## 2023-01-02 DIAGNOSIS — G309 Alzheimer's disease, unspecified: Secondary | ICD-10-CM | POA: Diagnosis not present

## 2023-01-02 NOTE — Progress Notes (Unsigned)
Provider: Marlowe Sax FNP-C  Chayse Zatarain, Nelda Bucks, NP  Patient Care Team: Nuri Larmer, Nelda Bucks, NP as PCP - General (Family Medicine)  Extended Emergency Contact Information Primary Emergency Contact: Heimann,[name] Address: [id number]          [place], [place] [address] [place] Phone: [phone] Mobile Phone: [phone] Relation: Son Secondary Emergency Contact: [place] Mobile Phone: [phone] Relation: Grandson Preferred language: English Interpreter needed? No  Code Status:  Full Code  Goals of care: Advanced Directive information    12/30/2022   11:02 AM  Advanced Directives  Does Patient Have a Medical Advance Directive? No  Would patient like information on creating a medical advance directive? No - Patient declined     No chief complaint on file.   HPI:  Pt is a 89 y.o. male seen today for an acute visit for transition of care post Hospitalization from 12/30/2022 - 12/31/2022 States was given a walker but does not use.   Past Medical History:  Diagnosis Date   Arthritis    hp bone,knees,hands,shoulders   BPH (benign prostatic hyperplasia)    BPH (benign prostatic hyperplasia)    Cataract    Dementia without behavioral disturbance ([place]) 12/30/2022   Diverticulosis    GERD (gastroesophageal reflux disease)    Glaucoma    History of stroke    Hyperlipemia    Hypertension    Loss of appetite    MCI (mild cognitive impairment)    Memory impairment    Sleep apnea    Past Surgical History:  Procedure Laterality Date   BUNIONECTOMY Left 05/16/2016   CATARACT EXTRACTION Bilateral 02/2013   TRANSURETHRAL RESECTION OF PROSTATE  1997    Allergies  Allergen Reactions   Donepezil     Outpatient Encounter Medications as of 01/02/2023  Medication Sig   aspirin EC 81 MG tablet Take 81 mg by mouth daily. Swallow whole.   atorvastatin (LIPITOR) 40 MG tablet Take 1 tablet by mouth once daily   Cholecalciferol (D3 ADULT PO) Take 1  tablet by mouth daily.   esomeprazole (NEXIUM) 20 MG capsule Take 1 capsule (20 mg total) by mouth daily as needed (for acid reflux). (Patient not taking: Reported on 01/02/2023)   loratadine (CLARITIN) 10 MG tablet Take 10 mg by mouth daily. Take one tablet daily   losartan (COZAAR) 50 MG tablet Take 50 mg by mouth daily.   memantine (NAMENDA) 5 MG tablet Take 1 tablet (5 mg total) by mouth 2 (two) times daily.   Multiple Vitamins-Minerals (ONE DAILY COMPLETE FOR MEN) TABS Take 1 tablet by mouth daily.   No facility-administered encounter medications on file as of 01/02/2023.    Review of Systems  Immunization History  Administered Date(s) Administered   Covid-19, Mrna,Vaccine(Spikevax)77yr and older 08/03/2022   Influenza, High Dose Seasonal PF 07/15/2022   PFIZER Comirnaty(Gray Top)Covid-19 Tri-Sucrose Vaccine 07/21/2020   PFIZER(Purple Top)SARS-COV-2 Vaccination 11/21/2019, 12/12/2019   Pfizer Covid-19 Vaccine Bivalent Booster 171yr& up 09/23/2021   Pneumococcal Conjugate-13 12/09/2017, 05/27/2018   Pneumococcal Polysaccharide-23 04/18/2021   Tdap 04/17/2018   Zoster Recombinat (Shingrix) 08/20/2018, 04/18/2021   Pertinent  Health Maintenance Due  Topic Date Due   INFLUENZA VACCINE  Completed      10/25/2021    8:29 AM 12/10/2022    9:30 AM 01/02/2023    2:08 PM  Fall Risk  Falls in the past year?  0 0  Was there an injury with Fall?  0 0  Fall Risk  Category Calculator  0 0  (RETIRED) Patient Fall Risk Level Low fall risk    Patient at Risk for Falls Due to  No Fall Risks No Fall Risks  Fall risk Follow up  Falls evaluation completed Falls evaluation completed   Functional Status Survey:    There were no vitals filed for this visit. There is no height or weight on file to calculate BMI. Physical Exam  Labs reviewed: Recent Labs    12/04/22 1520 12/30/22 0122 12/31/22 0747  NA 140 141 137  K 4.0 3.7 3.9  CL 103 106 106  CO2 '28 26 25  '$ GLUCOSE 86 109* 89  BUN '13  14 10  '$ CREATININE 0.91 1.34* 0.96  CALCIUM 10.6* 10.7* 10.0   Recent Labs    12/04/22 1520 12/30/22 0122  AST 11 20  ALT 6* 10  ALKPHOS  --  69  BILITOT 0.7 0.8  PROT 6.9 6.5  ALBUMIN  --  3.5   Recent Labs    12/04/22 1520 12/30/22 0122  WBC 4.2 6.5  NEUTROABS 1,999  --   HGB 13.9 13.1  HCT 40.2 37.3*  MCV 98.3 95.9  PLT 237 187   Lab Results  Component Value Date   TSH 1.75 12/04/2022   Lab Results  Component Value Date   HGBA1C 5.6 08/05/2022   Lab Results  Component Value Date   CHOL 124 12/04/2022   HDL 54 12/04/2022   LDLCALC 57 12/04/2022   TRIG 58 12/04/2022   CHOLHDL 2.3 12/04/2022    Significant Diagnostic Results in last 30 days:  DG Chest Port 1 View  Result Date: 12/30/2022 CLINICAL DATA:  Possible sepsis EXAM: PORTABLE CHEST 1 VIEW COMPARISON:  07/25/2022 FINDINGS: Cardiac shadow is stable. Aortic calcifications are seen. The lungs are well aerated bilaterally. No bony abnormality is noted. IMPRESSION: No acute abnormality noted. Electronically Signed   By: Inez Catalina M.D.   On: 12/30/2022 01:26    Assessment/Plan There are no diagnoses linked to this encounter.   Family/ staff Communication: Reviewed plan of care with patient  Labs/tests ordered: None   Next Appointment:   Sandrea Hughs, NP

## 2023-01-02 NOTE — Progress Notes (Signed)
Provider: Marlowe Sax FNP-C   Escher Harr, Nelda Bucks, NP  Patient Care Team: Vallory Oetken, Nelda Bucks, NP as PCP - General (Family Medicine)  Extended Emergency Contact Information Primary Emergency Contact: Boback,[name] Address: [id number]          [place], [place] [address] [place] Phone: [phone] Mobile Phone: [phone] Relation: Son Secondary Emergency Contact: [place] Mobile Phone: [phone] Relation: Grandson Preferred language: English Interpreter needed? No  Code Status:  Full Code  Goals of care: Advanced Directive information    12/30/2022   11:02 AM  Advanced Directives  Does Patient Have a Medical Advance Directive? No  Would patient like information on creating a medical advance directive? No - Patient declined     Chief Complaint  Patient presents with   Transitions Of Care    TOC 3/5/20234 - 12/31/2022, Hospital would like another kidney function test performed     HPI:  Pt is a 89 y.o. male seen today for hospital follow up 12/30/2022 - 12/31/2022 post hospitalization after presenting to the ED with fever,nausea ,vomiting and AKI suspected due to Gastroenteritis.CXR,RSV,Flu and U/A were all negative.IVF was given for dehydration.AKI resumed to baseline.Losartan was held but was resumed at discharge.  Past Medical History:  Diagnosis Date   Arthritis    hp bone,knees,hands,shoulders   BPH (benign prostatic hyperplasia)    BPH (benign prostatic hyperplasia)    Cataract    Dementia without behavioral disturbance ([place]) 12/30/2022   Diverticulosis    GERD (gastroesophageal reflux disease)    Glaucoma    History of stroke    Hyperlipemia    Hypertension    Loss of appetite    MCI (mild cognitive impairment)    Memory impairment    Sleep apnea    Past Surgical History:  Procedure Laterality Date   BUNIONECTOMY Left 05/16/2016   CATARACT EXTRACTION Bilateral 02/2013   TRANSURETHRAL RESECTION OF PROSTATE  1997     Allergies  Allergen Reactions   Donepezil     Allergies as of 01/02/2023       Reactions   Donepezil         Medication List        Accurate as of January 02, 2023  2:49 PM. If you have any questions, ask your nurse or doctor.          aspirin EC 81 MG tablet Take 81 mg by mouth daily. Swallow whole.   atorvastatin 40 MG tablet Commonly known as: LIPITOR Take 1 tablet by mouth once daily   D3 ADULT PO Take 1 tablet by mouth daily.   esomeprazole 20 MG capsule Commonly known as: NEXIUM Take 1 capsule (20 mg total) by mouth daily as needed (for acid reflux).   loratadine 10 MG tablet Commonly known as: CLARITIN Take 10 mg by mouth daily. Take one tablet daily   losartan 50 MG tablet Commonly known as: COZAAR Take 50 mg by mouth daily.   memantine 5 MG tablet Commonly known as: Namenda Take 1 tablet (5 mg total) by mouth 2 (two) times daily.   One Daily Complete for Men Tabs Take 1 tablet by mouth daily.        Review of Systems  Constitutional:  Negative for appetite change, chills, fatigue, fever and unexpected weight change.  HENT:  Negative for congestion, dental problem, ear discharge, ear pain, facial swelling, hearing loss, nosebleeds, postnasal drip, rhinorrhea, sinus pressure, sinus pain, sneezing, sore throat, tinnitus and trouble swallowing.  Eyes:  Negative for pain, discharge, redness, itching and visual disturbance.  Respiratory:  Negative for cough, chest tightness, shortness of breath and wheezing.   Cardiovascular:  Negative for chest pain, palpitations and leg swelling.  Gastrointestinal:  Negative for abdominal distention, abdominal pain, blood in stool, constipation, diarrhea, nausea and vomiting.  Endocrine: Negative for cold intolerance, heat intolerance, polydipsia, polyphagia and polyuria.  Genitourinary:  Negative for difficulty urinating, dysuria, flank pain, frequency and urgency.  Musculoskeletal:  Negative for arthralgias,  back pain, gait problem, joint swelling, myalgias, neck pain and neck stiffness.  Skin:  Negative for color change, pallor, rash and wound.  Neurological:  Negative for dizziness, syncope, speech difficulty, weakness, light-headedness, numbness and headaches.  Hematological:  Does not bruise/bleed easily.  Psychiatric/Behavioral:  Negative for agitation, behavioral problems, confusion, hallucinations, self-injury, sleep disturbance and suicidal ideas. The patient is not nervous/anxious.     Immunization History  Administered Date(s) Administered   Covid-19, Mrna,Vaccine(Spikevax)35yrs and older 08/03/2022   Influenza, High Dose Seasonal PF 07/15/2022   PFIZER Comirnaty(Gray Top)Covid-19 Tri-Sucrose Vaccine 07/21/2020   PFIZER(Purple Top)SARS-COV-2 Vaccination 11/21/2019, 12/12/2019   Pfizer Covid-19 Vaccine Bivalent Booster 84yrs & up 09/23/2021   Pneumococcal Conjugate-13 12/09/2017, 05/27/2018   Pneumococcal Polysaccharide-23 04/18/2021   Tdap 04/17/2018   Zoster Recombinat (Shingrix) 08/20/2018, 04/18/2021   Pertinent  Health Maintenance Due  Topic Date Due   INFLUENZA VACCINE  Completed      10/25/2021    8:29 AM 12/10/2022    9:30 AM 01/02/2023    2:08 PM  Fall Risk  Falls in the past year?  0 0  Was there an injury with Fall?  0 0  Fall Risk Category Calculator  0 0  (RETIRED) Patient Fall Risk Level Low fall risk    Patient at Risk for Falls Due to  No Fall Risks No Fall Risks  Fall risk Follow up  Falls evaluation completed Falls evaluation completed   Functional Status Survey:    Vitals:   01/02/23 1415  BP: 120/82  Pulse: (!) 56  Temp: 98.2 F (36.8 C)  TempSrc: Temporal  SpO2: 98%  Weight: 119 lb 6.4 oz (54.2 kg)   Body mass index is 19.27 kg/m. Physical Exam Vitals reviewed.  Constitutional:      General: He is not in acute distress.    Appearance: Normal appearance. He is normal weight. He is not ill-appearing or diaphoretic.  HENT:     Head:  Normocephalic.     Right Ear: There is impacted cerumen.     Left Ear: There is impacted cerumen.     Nose: Nose normal. No congestion or rhinorrhea.     Mouth/Throat:     Mouth: Mucous membranes are moist.     Pharynx: Oropharynx is clear. No oropharyngeal exudate or posterior oropharyngeal erythema.  Eyes:     General: No scleral icterus.       Right eye: No discharge.        Left eye: No discharge.     Extraocular Movements: Extraocular movements intact.     Conjunctiva/sclera: Conjunctivae normal.     Pupils: Pupils are equal, round, and reactive to light.  Neck:     Vascular: No carotid bruit.  Cardiovascular:     Rate and Rhythm: Normal rate and regular rhythm.     Pulses: Normal pulses.     Heart sounds: Normal heart sounds. No murmur heard.    No friction rub. No gallop.  Pulmonary:     Effort:  Pulmonary effort is normal. No respiratory distress.     Breath sounds: Normal breath sounds. No wheezing, rhonchi or rales.  Chest:     Chest wall: No tenderness.  Abdominal:     General: Bowel sounds are normal. There is no distension.     Palpations: Abdomen is soft. There is no mass.     Tenderness: There is no abdominal tenderness. There is no right CVA tenderness, left CVA tenderness, guarding or rebound.  Musculoskeletal:        General: No swelling or tenderness. Normal range of motion.     Cervical back: Normal range of motion. No rigidity or tenderness.     Right lower leg: No edema.     Left lower leg: No edema.  Lymphadenopathy:     Cervical: No cervical adenopathy.  Skin:    General: Skin is warm and dry.     Coloration: Skin is not pale.     Findings: No bruising, erythema, lesion or rash.  Neurological:     Mental Status: He is alert and oriented to person, place, and time.     Cranial Nerves: No cranial nerve deficit.     Sensory: No sensory deficit.     Motor: No weakness.     Coordination: Coordination normal.     Gait: Gait normal.  Psychiatric:         Mood and Affect: Mood normal.        Speech: Speech normal.        Behavior: Behavior normal.        Thought Content: Thought content normal.        Judgment: Judgment normal.     Labs reviewed: Recent Labs    12/04/22 1520 12/30/22 0122 12/31/22 0747  NA 140 141 137  K 4.0 3.7 3.9  CL 103 106 106  CO2 28 26 25   GLUCOSE 86 109* 89  BUN 13 14 10   CREATININE 0.91 1.34* 0.96  CALCIUM 10.6* 10.7* 10.0   Recent Labs    12/04/22 1520 12/30/22 0122  AST 11 20  ALT 6* 10  ALKPHOS  --  69  BILITOT 0.7 0.8  PROT 6.9 6.5  ALBUMIN  --  3.5   Recent Labs    12/04/22 1520 12/30/22 0122  WBC 4.2 6.5  NEUTROABS 1,999  --   HGB 13.9 13.1  HCT 40.2 37.3*  MCV 98.3 95.9  PLT 237 187   Lab Results  Component Value Date   TSH 1.75 12/04/2022   Lab Results  Component Value Date   HGBA1C 5.6 08/05/2022   Lab Results  Component Value Date   CHOL 124 12/04/2022   HDL 54 12/04/2022   LDLCALC 57 12/04/2022   TRIG 58 12/04/2022   CHOLHDL 2.3 12/04/2022    Significant Diagnostic Results in last 30 days:  DG Chest Port 1 View  Result Date: 12/30/2022 CLINICAL DATA:  Possible sepsis EXAM: PORTABLE CHEST 1 VIEW COMPARISON:  07/25/2022 FINDINGS: Cardiac shadow is stable. Aortic calcifications are seen. The lungs are well aerated bilaterally. No bony abnormality is noted. IMPRESSION: No acute abnormality noted. Electronically Signed   By: Inez Catalina M.D.   On: 12/30/2022 01:26    Assessment/Plan  1. Primary hypertension B/p well controlled  - continue losartan  2. Alzheimer's dementia without behavioral disturbance ([place]) - no new behavioral issues reported  - continue memantine  - continue with supportive care    3. Gastroesophageal reflux disease without esophagitis Symptoms controlled. H/H  stable.No tarry or black stool  - advised to avoid eating meals late in the evening and to avoid aggravating foods and spices. - continue on esomeprazole  4. Hyperlipidemia LDL  goal <100 LDL at goal. - continue on atorvastatin   - dietary modification and exercise as tolerated  Family/ staff Communication: Reviewed plan of care with patient and son verbalized understanding.    Labs/tests ordered: Decline labs states done in ED  Next Appointment :Return if symptoms worsen or fail to improve.   Sandrea Hughs, NP

## 2023-01-03 LAB — COMPLETE METABOLIC PANEL WITH GFR
AG Ratio: 1.3 (calc) (ref 1.0–2.5)
ALT: 14 U/L (ref 9–46)
AST: 32 U/L (ref 10–35)
Albumin: 3.6 g/dL (ref 3.6–5.1)
Alkaline phosphatase (APISO): 65 U/L (ref 35–144)
BUN: 9 mg/dL (ref 7–25)
CO2: 28 mmol/L (ref 20–32)
Calcium: 10.4 mg/dL — ABNORMAL HIGH (ref 8.6–10.3)
Chloride: 106 mmol/L (ref 98–110)
Creat: 1.03 mg/dL (ref 0.70–1.22)
Globulin: 2.7 g/dL (calc) (ref 1.9–3.7)
Glucose, Bld: 95 mg/dL (ref 65–99)
Potassium: 4 mmol/L (ref 3.5–5.3)
Sodium: 140 mmol/L (ref 135–146)
Total Bilirubin: 0.3 mg/dL (ref 0.2–1.2)
Total Protein: 6.3 g/dL (ref 6.1–8.1)
eGFR: 69 mL/min/{1.73_m2} (ref >=60)

## 2023-01-03 LAB — CBC WITH DIFFERENTIAL/PLATELET
Absolute Monocytes: 439 cells/uL (ref 200–950)
Basophils Absolute: 29 cells/uL (ref 0–200)
Basophils Relative: 0.8 %
Eosinophils Absolute: 68 cells/uL (ref 15–500)
Eosinophils Relative: 1.9 %
HCT: 35.5 % — ABNORMAL LOW (ref 38.5–50.0)
Hemoglobin: 12.3 g/dL — ABNORMAL LOW (ref 13.2–17.1)
Lymphs Abs: 1562 cells/uL (ref 850–3900)
MCH: 33.3 pg — ABNORMAL HIGH (ref 27.0–33.0)
MCHC: 34.6 g/dL (ref 32.0–36.0)
MCV: 96.2 fL (ref 80.0–100.0)
MPV: 10.4 fL (ref 7.5–12.5)
Monocytes Relative: 12.2 %
Neutro Abs: 1501 cells/uL (ref 1500–7800)
Neutrophils Relative %: 41.7 %
Platelets: 198 10*3/uL (ref 140–400)
RBC: 3.69 10*6/uL — ABNORMAL LOW (ref 4.20–5.80)
RDW: 12.7 % (ref 11.0–15.0)
Total Lymphocyte: 43.4 %
WBC: 3.6 10*3/uL — ABNORMAL LOW (ref 3.8–10.8)

## 2023-01-04 LAB — CULTURE, BLOOD (ROUTINE X 2)
Culture: NO GROWTH
Culture: NO GROWTH
Special Requests: ADEQUATE
Special Requests: ADEQUATE

## 2023-01-06 ENCOUNTER — Encounter: Payer: Self-pay | Admitting: Family

## 2023-01-06 NOTE — Progress Notes (Signed)
Patient required evaluation for shortness of breath

## 2023-01-07 ENCOUNTER — Encounter: Payer: PPO | Admitting: Family

## 2023-01-07 NOTE — Progress Notes (Signed)
  This encounter was created in error - please disregard. No show 

## 2023-01-12 ENCOUNTER — Other Ambulatory Visit: Payer: Self-pay

## 2023-01-12 MED ORDER — MIRTAZAPINE 7.5 MG PO TABS: 7.5000 mg | ORAL_TABLET | Freq: Every day | ORAL | 1 refills | Status: DC

## 2023-01-12 NOTE — Addendum Note (Signed)
Addended by: Carroll Kinds on: 01/12/2023 05:05 PM   Modules accepted: Orders

## 2023-01-15 ENCOUNTER — Encounter: Payer: PPO | Admitting: Family

## 2023-01-15 ENCOUNTER — Telehealth: Payer: Self-pay

## 2023-01-15 NOTE — Telephone Encounter (Signed)
Barnett Applebaum with [facility] called to request verbal orders to change speech therapy evaluation to 01/18/23 per patient request (was originally set for 01/11/23.  Per [employer] standing order, verbal order given.

## 2023-01-21 ENCOUNTER — Ambulatory Visit (INDEPENDENT_AMBULATORY_CARE_PROVIDER_SITE_OTHER): Payer: PPO | Admitting: Family

## 2023-01-21 ENCOUNTER — Encounter: Payer: Self-pay | Admitting: Family

## 2023-01-21 DIAGNOSIS — Z0000 Encounter for general adult medical examination without abnormal findings: Secondary | ICD-10-CM | POA: Diagnosis not present

## 2023-01-21 NOTE — Patient Instructions (Addendum)
Jason Castaneda , Thank you for taking time to come for your Medicare Wellness Visit. I appreciate your ongoing commitment to your health goals. Please review the following plan we discussed and let me know if I can assist you in the future.   Screening recommendations/referrals: Colonoscopy N/A  Recommended yearly ophthalmology/optometry visit for glaucoma screening and checkup Recommended yearly dental visit for hygiene and checkup  Vaccinations: Influenza vaccine due annually in September/October Pneumococcal vaccine : Up to date  Tdap vaccine : Up to date  Shingles vaccine : Up to date     Advanced directives: Yes   Conditions/risks identified:  advanced age (>95men, >57 women);male gender;hypertension;dyslipidemia;sedentary lifestyle;family history of premature cardiovascular disease  Next appointment: 1 year   Preventive Care 51 Years and Older, Male Preventive care refers to lifestyle choices and visits with your health care provider that can promote health and wellness. What does preventive care include? A yearly physical exam. This is also called an annual well check. Dental exams once or twice a year. Routine eye exams. Ask your health care provider how often you should have your eyes checked. Personal lifestyle choices, including: Daily care of your teeth and gums. Regular physical activity. Eating a healthy diet. Avoiding tobacco and drug use. Limiting alcohol use. Practicing safe sex. Taking low doses of aspirin every day. Taking vitamin and mineral supplements as recommended by your health care provider. What happens during an annual well check? The services and screenings done by your health care provider during your annual well check will depend on your age, overall health, lifestyle risk factors, and family history of disease. Counseling  Your health care provider may ask you questions about your: Alcohol use. Tobacco use. Drug use. Emotional well-being. Home  and relationship well-being. Sexual activity. Eating habits. History of falls. Memory and ability to understand (cognition). Work and work Statistician. Screening  You may have the following tests or measurements: Height, weight, and BMI. Blood pressure. Lipid and cholesterol levels. These may be checked every 5 years, or more frequently if you are over 57 years old. Skin check. Lung cancer screening. You may have this screening every year starting at age 13 if you have a 30-pack-year history of smoking and currently smoke or have quit within the past 15 years. Fecal occult blood test (FOBT) of the stool. You may have this test every year starting at age 29. Flexible sigmoidoscopy or colonoscopy. You may have a sigmoidoscopy every 5 years or a colonoscopy every 10 years starting at age 76. Prostate cancer screening. Recommendations will vary depending on your family history and other risks. Hepatitis C blood test. Hepatitis B blood test. Sexually transmitted disease (STD) testing. Diabetes screening. This is done by checking your blood sugar (glucose) after you have not eaten for a while (fasting). You may have this done every 1-3 years. Abdominal aortic aneurysm (AAA) screening. You may need this if you are a current or former smoker. Osteoporosis. You may be screened starting at age 39 if you are at high risk. Talk with your health care provider about your test results, treatment options, and if necessary, the need for more tests. Vaccines  Your health care provider may recommend certain vaccines, such as: Influenza vaccine. This is recommended every year. Tetanus, diphtheria, and acellular pertussis (Tdap, Td) vaccine. You may need a Td booster every 10 years. Zoster vaccine. You may need this after age 22. Pneumococcal 13-valent conjugate (PCV13) vaccine. One dose is recommended after age 69. Pneumococcal polysaccharide (PPSV23) vaccine.  One dose is recommended after age 45. Talk to  your health care provider about which screenings and vaccines you need and how often you need them. This information is not intended to replace advice given to you by your health care provider. Make sure you discuss any questions you have with your health care provider. Document Released: 11/09/2015 Document Revised: 07/02/2016 Document Reviewed: 08/14/2015 Elsevier Interactive Patient Education  2017 [place] Prevention in the Home Falls can cause injuries. They can happen to people of all ages. There are many things you can do to make your home safe and to help prevent falls. What can I do on the outside of my home? Regularly fix the edges of walkways and driveways and fix any cracks. Remove anything that might make you trip as you walk through a door, such as a raised step or threshold. Trim any bushes or trees on the path to your home. Use bright outdoor lighting. Clear any walking paths of anything that might make someone trip, such as rocks or tools. Regularly check to see if handrails are loose or broken. Make sure that both sides of any steps have handrails. Any raised decks and porches should have guardrails on the edges. Have any leaves, snow, or ice cleared regularly. Use sand or salt on walking paths during winter. Clean up any spills in your garage right away. This includes oil or grease spills. What can I do in the bathroom? Use night lights. Install grab bars by the toilet and in the tub and shower. Do not use towel bars as grab bars. Use non-skid mats or decals in the tub or shower. If you need to sit down in the shower, use a plastic, non-slip stool. Keep the floor dry. Clean up any water that spills on the floor as soon as it happens. Remove soap buildup in the tub or shower regularly. Attach bath mats securely with double-sided non-slip rug tape. Do not have throw rugs and other things on the floor that can make you trip. What can I do in the bedroom? Use  night lights. Make sure that you have a light by your bed that is easy to reach. Do not use any sheets or blankets that are too big for your bed. They should not hang down onto the floor. Have a firm chair that has side arms. You can use this for support while you get dressed. Do not have throw rugs and other things on the floor that can make you trip. What can I do in the kitchen? Clean up any spills right away. Avoid walking on wet floors. Keep items that you use a lot in easy-to-reach places. If you need to reach something above you, use a strong step stool that has a grab bar. Keep electrical cords out of the way. Do not use floor polish or wax that makes floors slippery. If you must use wax, use non-skid floor wax. Do not have throw rugs and other things on the floor that can make you trip. What can I do with my stairs? Do not leave any items on the stairs. Make sure that there are handrails on both sides of the stairs and use them. Fix handrails that are broken or loose. Make sure that handrails are as long as the stairways. Check any carpeting to make sure that it is firmly attached to the stairs. Fix any carpet that is loose or worn. Avoid having throw rugs at the top or bottom of  the stairs. If you do have throw rugs, attach them to the floor with carpet tape. Make sure that you have a light switch at the top of the stairs and the bottom of the stairs. If you do not have them, ask someone to add them for you. What else can I do to help prevent falls? Wear shoes that: Do not have high heels. Have rubber bottoms. Are comfortable and fit you well. Are closed at the toe. Do not wear sandals. If you use a stepladder: Make sure that it is fully opened. Do not climb a closed stepladder. Make sure that both sides of the stepladder are locked into place. Ask someone to hold it for you, if possible. Clearly mark and make sure that you can see: Any grab bars or handrails. First and last  steps. Where the edge of each step is. Use tools that help you move around (mobility aids) if they are needed. These include: Canes. Walkers. Scooters. Crutches. Turn on the lights when you go into a dark area. Replace any light bulbs as soon as they burn out. Set up your furniture so you have a clear path. Avoid moving your furniture around. If any of your floors are uneven, fix them. If there are any pets around you, be aware of where they are. Review your medicines with your doctor. Some medicines can make you feel dizzy. This can increase your chance of falling. Ask your doctor what other things that you can do to help prevent falls. This information is not intended to replace advice given to you by your health care provider. Make sure you discuss any questions you have with your health care provider. Document Released: 08/09/2009 Document Revised: 03/20/2016 Document Reviewed: 11/17/2014 Elsevier Interactive Patient Education  2017 [employer].

## 2023-01-21 NOTE — Progress Notes (Addendum)
Subjective:   Jason MACLELLAN Sr. is a 89 y.o. male who presents for Medicare Annual/Subsequent preventive examination.  Review of Systems     Cardiac Risk Factors include: advanced age (>57men, >51 women);male gender;hypertension;dyslipidemia;sedentary lifestyle;family history of premature cardiovascular disease     Objective:    There were no vitals filed for this visit. There is no height or weight on file to calculate BMI.     01/21/2023    2:28 PM 12/30/2022   11:02 AM 12/03/2022    4:25 PM 08/18/2022    1:18 PM 03/25/2015    3:01 PM 03/23/2015    8:48 PM 03/23/2015    2:58 PM  Advanced Directives  Does Patient Have a Medical Advance Directive? Yes No Yes Yes  Yes No  Type of Paramedic of [place];Living will  [place];Living will [place];Living will [place]   Does patient want to make changes to medical advance directive? No - Patient declined  No - Patient declined No - Patient declined  No - Patient declined   Copy of [place] in Chart? Yes - validated most recent copy scanned in chart (See row information)  Yes - validated most recent copy scanned in chart (See row information)   No - copy requested   Would patient like information on creating a medical advance directive? No - Patient declined No - Patient declined         Current Medications (verified) Outpatient Encounter Medications as of 01/21/2023  Medication Sig   aspirin EC 81 MG tablet Take 81 mg by mouth daily. Swallow whole.   atorvastatin (LIPITOR) 40 MG tablet Take 1 tablet by mouth once daily   Cholecalciferol (D3 ADULT PO) Take 1 tablet by mouth daily.   esomeprazole (NEXIUM) 20 MG capsule Take 1 capsule (20 mg total) by mouth daily as needed (for acid reflux).   loratadine (CLARITIN) 10 MG tablet Take 10 mg by mouth daily. Take one tablet daily   losartan (COZAAR)  50 MG tablet Take 50 mg by mouth daily.   memantine (NAMENDA) 5 MG tablet Take 1 tablet (5 mg total) by mouth 2 (two) times daily.   mirtazapine (REMERON) 7.5 MG tablet Take 1 tablet (7.5 mg total) by mouth at bedtime. For appetite   Multiple Vitamins-Minerals (ONE DAILY COMPLETE FOR MEN) TABS Take 1 tablet by mouth daily.   No facility-administered encounter medications on file as of 01/21/2023.    Allergies (verified) Donepezil   History: Past Medical History:  Diagnosis Date   Arthritis    hp bone,knees,hands,shoulders   BPH (benign prostatic hyperplasia)    BPH (benign prostatic hyperplasia)    Cataract    Dementia without behavioral disturbance ([place]) 12/30/2022   Diverticulosis    GERD (gastroesophageal reflux disease)    Glaucoma    History of stroke    Hyperlipemia    Hypertension    Loss of appetite    MCI (mild cognitive impairment)    Memory impairment    Sleep apnea    Past Surgical History:  Procedure Laterality Date   BUNIONECTOMY Left 05/16/2016   CATARACT EXTRACTION Bilateral 02/2013   TRANSURETHRAL RESECTION OF PROSTATE  1997   History reviewed. No pertinent family history. Social History   Socioeconomic History   Marital status: Widowed    Spouse name: Not on file   Number of children: 2   Years of education:  Not on file   Highest education level: Master's degree (e.g., MA, MS, MEng, MEd, MSW, MBA)  Occupational History   Not on file  Tobacco Use   Smoking status: Never   Smokeless tobacco: Never   Tobacco comments:    Used in 20's  Vaping Use   Vaping Use: Never used  Substance and Sexual Activity   Alcohol use: No   Drug use: No   Sexual activity: Never  Other Topics Concern   Not on file  Social History Narrative   01/30/22 lives with son   Social Determinants of Health   Financial Resource Strain: Not on file  Food Insecurity: No Food Insecurity (12/30/2022)   Hunger Vital Sign    Worried About Running Out of Food in the Last Year:  Never true    Ran Out of Food in the Last Year: Never true  Transportation Needs: No Transportation Needs (12/30/2022)   PRAPARE - Hydrologist (Medical): No    Lack of Transportation (Non-Medical): No  Physical Activity: Not on file  Stress: Not on file  Social Connections: Not on file    Tobacco Counseling Counseling given: Not Answered Tobacco comments: Used in 20's   Clinical Intake:  Pre-visit preparation completed: No  Pain : No/denies pain     BMI - recorded: 19.27 Nutritional Status: BMI of 19-24  Normal Nutritional Risks: None Diabetes: No     Diabetic?No          Activities of Daily Living    01/21/2023    3:18 PM 01/21/2023    2:28 PM  In your present state of health, do you have any difficulty performing the following activities:  Hearing? 0 0  Vision? 0 0  Difficulty concentrating or making decisions? 1 1  Comment Remembering   Walking or climbing stairs? 1 0  Comment uses a walker   Dressing or bathing? 0 0  Doing errands, shopping? 1 1  Comment son Diplomatic Services operational officer and eating ? Tempie Donning  Comment son assist   Using the Toilet? N N  In the past six months, have you accidently leaked urine?  Y  Do you have problems with loss of bowel control? N N  Managing your Medications? Tempie Donning  Comment son assist   Managing your Finances? Tempie Donning  Comment son assist   Housekeeping or managing your Housekeeping? Tempie Donning  Comment Has Chartered certified accountant and son assist     Patient Care Team: Azana Kiesler, Nelda Bucks, NP as PCP - General (Family Medicine)  Indicate any recent Medical Services you may have received from other than [facility] providers in the past year (date may be approximate).     Assessment:   This is a routine wellness examination for Jason Castaneda.  Hearing/Vision screen No results found.  Dietary issues and exercise activities discussed: Current Exercise Habits: The patient does not participate in regular exercise at present, Type of  exercise: walking, Time (Minutes): 20, Frequency (Times/Week): 3, Weekly Exercise (Minutes/Week): 60, Intensity: Mild, Exercise limited by: Other - see comments (dementia)   Goals Addressed             This Visit's Progress    Patient Stated       States healthy        Depression Screen    01/21/2023    2:28 PM 12/10/2022    9:30 AM 12/04/2022    1:56 PM  PHQ 2/9 Scores  PHQ -  2 Score 0 0 0    Fall Risk    01/21/2023    2:27 PM 01/02/2023    2:08 PM 12/10/2022    9:30 AM  Fall Risk   Falls in the past year? 0 0 0  Number falls in past yr: 0 0 0  Injury with Fall? 0 0 0  Risk for fall due to :  No Fall Risks No Fall Risks  Follow up Falls evaluation completed;Education provided;Falls prevention discussed Falls evaluation completed Falls evaluation completed    FALL RISK PREVENTION PERTAINING TO THE HOME:  Any stairs in or around the home? Yes  If so, are there any without handrails? Yes  Home free of loose throw rugs in walkways, pet beds, electrical cords, etc? No  Adequate lighting in your home to reduce risk of falls? Yes   ASSISTIVE DEVICES UTILIZED TO PREVENT FALLS:  Life alert? No  Use of a cane, walker or w/c? Yes  Grab bars in the bathroom? No  Shower chair or bench in shower? Yes  Elevated toilet seat or a handicapped toilet? No   TIMED UP AND GO:  Was the test performed? No .  Length of time to ambulate 10 feet: N/A sec.   Gait slow and steady with assistive device  Cognitive Function:      08/05/2022    1:19 PM 01/30/2022    1:45 PM  Montreal Cognitive Assessment   Visuospatial/ Executive (0/5) 0 0  Naming (0/3) 0 2  Attention: Read list of digits (0/2) 2 2  Attention: Read list of letters (0/1) 1 0  Attention: Serial 7 subtraction starting at 100 (0/3) 2 3  Language: Repeat phrase (0/2) 2 1  Language : Fluency (0/1) 0 0  Abstraction (0/2) 1 2  Delayed Recall (0/5) 0 0  Orientation (0/6) 2 2  Total 10 12      01/21/2023    2:30 PM  6CIT  Screen  What Year? 4 points  What month? 3 points  What time? 3 points  Count back from 20 0 points  Months in reverse 0 points    Immunizations Immunization History  Administered Date(s) Administered   Covid-19, Mrna,Vaccine(Spikevax)65yrs and older 08/03/2022   Influenza, High Dose Seasonal PF 07/15/2022   PFIZER Comirnaty(Gray Top)Covid-19 Tri-Sucrose Vaccine 07/21/2020   PFIZER(Purple Top)SARS-COV-2 Vaccination 11/21/2019, 12/12/2019   Pfizer Covid-19 Vaccine Bivalent Booster 11yrs & up 09/23/2021   Pneumococcal Conjugate-13 12/09/2017, 05/27/2018   Pneumococcal Polysaccharide-23 04/18/2021   Rsv, Bivalent, Protein Subunit Rsvpref,pf Evans Lance) 07/15/2022   Tdap 04/17/2018   Zoster Recombinat (Shingrix) 08/20/2018, 04/18/2021    TDAP status: Up to date  Flu Vaccine status: Up to date  Pneumococcal vaccine status: Up to date  Covid-19 vaccine status: Information provided on how to obtain vaccines.   Qualifies for Shingles Vaccine? Yes   Zostavax completed Yes   Shingrix Completed?: Yes  Screening Tests Health Maintenance  Topic Date Due   COVID-19 Vaccine (6 - 2023-24 season) 09/28/2022   Medicare Annual Wellness (AWV)  01/21/2024   DTaP/Tdap/Td (2 - Td or Tdap) 04/17/2028   Pneumonia Vaccine 20+ Years old  Completed   INFLUENZA VACCINE  Completed   Zoster Vaccines- Shingrix  Completed   HPV VACCINES  Aged Out    Health Maintenance  Health Maintenance Due  Topic Date Due   COVID-19 Vaccine (6 - 2023-24 season) 09/28/2022    Colorectal cancer screening: No longer required.   Lung Cancer Screening: (Low Dose CT Chest recommended  if Age 91-80 years, 39 pack-year currently smoking OR have quit w/in 15years.) does not qualify.   Lung Cancer Screening Referral: No   Additional Screening:  Hepatitis C Screening: does not qualify; Completed No   Vision Screening: Recommended annual ophthalmology exams for early detection of glaucoma and other disorders of the  eye. Is the patient up to date with their annual eye exam?  No  Who is the provider or what is the name of the office in which the patient attends annual eye exams? Son will schedule  If pt is not established with a provider, would they like to be referred to a provider to establish care? Yes .   Dental Screening: Recommended annual dental exams for proper oral hygiene  Community Resource Referral / Chronic Care Management: CRR required this visit?  No   CCM required this visit?  No      Plan:     I have personally reviewed and noted the following in the patient's chart:   Medical and social history Use of alcohol, tobacco or illicit drugs  Current medications and supplements including opioid prescriptions. Patient is not currently taking opioid prescriptions. Functional ability and status Nutritional status Physical activity Advanced directives List of other physicians Hospitalizations, surgeries, and ER visits in previous 12 months Vitals Screenings to include cognitive, depression, and falls Referrals and appointments  In addition, I have reviewed and discussed with patient certain preventive protocols, quality metrics, and best practice recommendations. A written personalized care plan for preventive services as well as general preventive health recommendations were provided to patient.     Sandrea Hughs, NP   01/21/2023   Nurse Notes: due for COVID-19 vaccine advised to get vaccine at the pharmacy.   I connected with  CHRISTROPHER UNTIEDT Sr. on 01/21/23 by a video enabled telemedicine application and verified that I am speaking with the correct person using two identifiers.   I discussed the limitations of evaluation and management by telemedicine. The patient expressed understanding and agreed to proceed.  Spent 20 minutes of face to face with patient  >50% time spent counseling; reviewing medical record; completing AWV and developing future plan of care.

## 2023-01-26 ENCOUNTER — Telehealth: Payer: Self-pay

## 2023-01-26 NOTE — Telephone Encounter (Signed)
Gina with [facility] called and left message on clinical intake voicemail requesting verbal orders for 1 time a week for 5 weeks oraldysphagia and cognitive communication deficits. She would slos like to report thick white coating on tongue-thinks it may be thrush. She advised 3 times daily oral care. Please adivse.  Message routed to Marlowe Sax, NP

## 2023-01-26 NOTE — Telephone Encounter (Signed)
This encounter was created in error - please disregard.

## 2023-01-29 ENCOUNTER — Telehealth: Payer: Self-pay | Admitting: *Deleted

## 2023-01-29 NOTE — Telephone Encounter (Signed)
Jason Castaneda with [facility] called requesting verbal orders for OT and SW.  Verbal orders given.

## 2023-02-06 ENCOUNTER — Telehealth: Payer: PPO | Admitting: *Deleted

## 2023-02-06 NOTE — Telephone Encounter (Signed)
Will with [facility] called requesting verbal order for OT  2X1week, 1X1week.  Verbal orders given.

## 2023-02-09 NOTE — Progress Notes (Deleted)
   CC:  dementia  Follow-up Visit  Last visit: 08/05/22  Brief HPI: 89 year old male with a history of CVA, glaucoma, HTN, HLD who follows in clinic for dementia. Brain MRI 02/01/22 showed a remote right temporal stroke, not noted on prior CT scan in 2018. It also showed moderately severe cortical atrophy most pronounced in medial temporal lobes, consistent with Alzheimer's dementia. 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  At his last visit Namenda was increased to 5 mg BID. He was referred to PT for gait and balance.  Interval History: Memory***Namenda***  Lipitor, ASA***  PT for balance***   Physical Exam:   Vital Signs: There were no vitals taken for this visit. GENERAL:  well appearing, in no acute distress, alert  SKIN:  Color, texture, turgor normal. No rashes or lesions HEAD:  Normocephalic/atraumatic. RESP: normal respiratory effort MSK:  No gross joint deformities.   NEUROLOGICAL: Mental Status: Alert, oriented to person, place and time, Follows commands, and Speech fluent and appropriate. Cranial Nerves: PERRL, face symmetric, no dysarthria, hearing grossly intact Motor: moves all extremities equally Gait: normal-based.  IMPRESSION: ***  PLAN: ***   Follow-up: ***  I spent a total of *** minutes on the date of the service. Discussed medication side effects, adverse reactions and drug interactions. Written educational materials and patient instructions outlining all of the above were given.  Ocie Doyne, MD

## 2023-02-10 ENCOUNTER — Ambulatory Visit: Payer: PPO | Admitting: Psychiatry

## 2023-02-12 ENCOUNTER — Other Ambulatory Visit: Payer: Self-pay

## 2023-02-12 MED ORDER — LOSARTAN POTASSIUM 50 MG PO TABS
50.0000 mg | ORAL_TABLET | Freq: Every day | ORAL | 1 refills | Status: DC
  Filled 2023-02-12: qty 90, 90d supply, fill #0

## 2023-02-12 NOTE — Telephone Encounter (Signed)
From: Jason Castaneda. To: Office of Caesar Bookman, NP Sent: 02/12/2023 10:03 AM EDT Subject: Medication Renewal Request  Refills have been requested for the following medications:   losartan (COZAAR) 50 MG tablet  Preferred pharmacy: [facility] Delivery method: Daryll Drown

## 2023-02-16 ENCOUNTER — Encounter: Payer: Self-pay | Admitting: Adult Health

## 2023-02-16 ENCOUNTER — Ambulatory Visit: Payer: PPO | Admitting: Adult Health

## 2023-02-21 ENCOUNTER — Other Ambulatory Visit: Payer: Self-pay | Admitting: Psychiatry

## 2023-02-23 NOTE — Telephone Encounter (Signed)
Last seen on 08/05/22 per note "-Continue atorvastatin 40 mg daily, ASA 81 mg daily for stroke prevention "   No 6 month follow up scheduled

## 2023-02-26 NOTE — Progress Notes (Signed)
Chief Complaint  Patient presents with   Follow-up    Pt in room 2. Castaneda in room with pt. Here for Alzheimer disease follow up, pt has PT,ST,OT, trouble with eating (Castaneda states doesn't eat a lot) Castaneda states pt drinks about 1/2 ensure when pt not eating food. Castaneda states pt memory is mainly short term, able to remember family members.     HISTORY OF PRESENT ILLNESS:  03/02/23 ALL:  Jason Clinton Sr. is a 89 y.o. male here today for follow up for AD. Previous history of CVA. He was last seen by Dr Delena Bali 07/2022. Memantine 5mg  BID continued and PT referral placed due to recent fall. Since, he has done fairly well. He has tolerated increased dose of memantine. He lives with his Castaneda, Jason Castaneda, who manages meds. Appetite is better on some days than others. He will supplement with Ensure. He is sleeping well. He did complete PT as ordered by Dr Delena Bali. HH was ordered through PCP after recent hospitalization. PT/OT and ST worked with him following for about 11 visits. Jason Castaneda, Jason Castaneda, makes sure that Jason Castaneda and his older Castaneda have dinner every night.   He was hospitalized 12/30/2022-12/31/2022 for dehydration. Losartan stopped due to AKI. He is followed by Jason Castaneda, PCP. Labs stable. He continues asa 81mg  and atorvastatin 40mg  daily.    HISTORY (copied from Dr Quentin Mulling previous note)  89 year old male with a history of glaucoma, HTN, HLD who follows in clinic for dementia.   At his last visit, brain MRI was ordered. Namenda was uptitrated to 10 mg BID   Interval History: Brain MRI 02/01/22 showed a remote right temporal stroke, not noted on prior CT scan in 2018. It also showed moderately severe cortical atrophy most pronounced in medial temporal lobes, consistent with Alzheimer's dementia.   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 His lipitor was increased to 40 mg daily and he was started on ASA for stroke prevention.   TTE  showed EF 60-65% and was negative for shunt.   He never increased his dose of Namenda. Continues to take 5 mg daily. Feels memory is about the same since his last visit. He did have a fall a couple of months ago. Stumbled while walking and hit the ground with his knees. Did not hit his head. Castaneda notices he tends to slide his feet while walking.   REVIEW OF SYSTEMS: Out of a complete 14 system review of symptoms, the patient complains only of the following symptoms, memory loss, and all other reviewed systems are negative.   ALLERGIES: Allergies  Allergen Reactions   Donepezil      HOME MEDICATIONS: Outpatient Medications Prior to Visit  Medication Sig Dispense Refill   aspirin EC 81 MG tablet Take 81 mg by mouth daily. Swallow whole.     atorvastatin (LIPITOR) 40 MG tablet Take 1 tablet by mouth once daily 30 tablet 0   Cholecalciferol (D3 ADULT PO) Take 1 tablet by mouth daily.     esomeprazole (NEXIUM) 20 MG capsule Take 1 capsule (20 mg total) by mouth daily as needed (for acid reflux).     loratadine (CLARITIN) 10 MG tablet Take 10 mg by mouth daily. Take one tablet daily     losartan (COZAAR) 50 MG tablet Take 1 tablet (50 mg total) by mouth daily. 90 tablet 1   mirtazapine (REMERON) 7.5 MG tablet Take 1 tablet (  7.5 mg total) by mouth at bedtime. For appetite 90 tablet 1   Multiple Vitamins-Minerals (ONE DAILY COMPLETE FOR MEN) TABS Take 1 tablet by mouth daily.     memantine (NAMENDA) 5 MG tablet Take 1 tablet (5 mg total) by mouth 2 (two) times daily. 60 tablet 6   No facility-administered medications prior to visit.     PAST MEDICAL HISTORY: Past Medical History:  Diagnosis Date   Arthritis    hp bone,knees,hands,shoulders   BPH (benign prostatic hyperplasia)    BPH (benign prostatic hyperplasia)    Cataract    Dementia without behavioral disturbance (HCC) 12/30/2022   Diverticulosis    GERD (gastroesophageal reflux disease)    Glaucoma    History of stroke     Hyperlipemia    Hypertension    Loss of appetite    MCI (mild cognitive impairment)    Memory impairment    Sleep apnea      PAST SURGICAL HISTORY: Past Surgical History:  Procedure Laterality Date   BUNIONECTOMY Left 05/16/2016   CATARACT EXTRACTION Bilateral 02/2013   TRANSURETHRAL RESECTION OF PROSTATE  1997     FAMILY HISTORY: History reviewed. No pertinent family history.   SOCIAL HISTORY: Social History   Socioeconomic History   Marital status: Widowed    Spouse name: Not on file   Number of children: 2   Years of education: Not on file   Highest education level: Master's degree (e.g., MA, MS, MEng, MEd, MSW, MBA)  Occupational History   Not on file  Tobacco Use   Smoking status: Never   Smokeless tobacco: Never   Tobacco comments:    Used in 20's  Vaping Use   Vaping Use: Never used  Substance and Sexual Activity   Alcohol use: No   Drug use: No   Sexual activity: Never  Other Topics Concern   Not on file  Social History Narrative   01/30/22 lives with Castaneda   Social Determinants of Health   Financial Resource Strain: Not on file  Food Insecurity: No Food Insecurity (12/30/2022)   Hunger Vital Sign    Worried About Running Out of Food in the Last Year: Never true    Ran Out of Food in the Last Year: Never true  Transportation Needs: No Transportation Needs (12/30/2022)   PRAPARE - Administrator, Civil Service (Medical): No    Lack of Transportation (Non-Medical): No  Physical Activity: Not on file  Stress: Not on file  Social Connections: Not on file  Intimate Partner Violence: Not At Risk (12/30/2022)   Humiliation, Afraid, Rape, and Kick questionnaire    Fear of Current or Ex-Partner: No    Emotionally Abused: No    Physically Abused: No    Sexually Abused: No     PHYSICAL EXAM  Vitals:   03/02/23 1342  BP: 137/87  Pulse: 65  Weight: 116 lb 9.6 oz (52.9 kg)  Height: 5\' 6"  (1.676 m)   Body mass index is 18.82  kg/m.  Generalized: Well developed, in no acute distress  Cardiology: normal rate and rhythm, no murmur auscultated  Respiratory: clear to auscultation bilaterally    Neurological examination  Mentation: Alert, not oriented to time, place, history taking. Follows all commands speech and language fluent Cranial nerve II-XII: Pupils were equal round reactive to light. Extraocular movements were full, visual field were full on confrontational test. Facial sensation and strength were normal.  Head turning and shoulder shrug  were normal and  symmetric. Motor: The motor testing reveals 5 over 5 strength of all 4 extremities. Good symmetric motor tone is noted throughout.  Sensory: Sensory testing is intact to soft touch on all 4 extremities. No evidence of extinction is noted.  Gait and station: Able to push to standing position without difficulty. Gait is short, slightly unsteady without assistive device   DIAGNOSTIC DATA (LABS, IMAGING, TESTING) - I reviewed patient records, labs, notes, testing and imaging myself where available.  Lab Results  Component Value Date   WBC 3.6 (L) 01/02/2023   HGB 12.3 (L) 01/02/2023   HCT 35.5 (L) 01/02/2023   MCV 96.2 01/02/2023   PLT 198 01/02/2023      Component Value Date/Time   NA 140 01/02/2023 1450   K 4.0 01/02/2023 1450   CL 106 01/02/2023 1450   CO2 28 01/02/2023 1450   GLUCOSE 95 01/02/2023 1450   BUN 9 01/02/2023 1450   CREATININE 1.03 01/02/2023 1450   CALCIUM 10.4 (H) 01/02/2023 1450   PROT 6.3 01/02/2023 1450   ALBUMIN 3.5 12/30/2022 0122   AST 32 01/02/2023 1450   ALT 14 01/02/2023 1450   ALKPHOS 69 12/30/2022 0122   BILITOT 0.3 01/02/2023 1450   GFRNONAA >60 12/31/2022 0747   GFRAA >60 03/24/2015 0528   Lab Results  Component Value Date   CHOL 124 12/04/2022   HDL 54 12/04/2022   LDLCALC 57 12/04/2022   TRIG 58 12/04/2022   CHOLHDL 2.3 12/04/2022   Lab Results  Component Value Date   HGBA1C 5.6 08/05/2022   Lab  Results  Component Value Date   VITAMINB12 527 01/30/2022   Lab Results  Component Value Date   TSH 1.75 12/04/2022        No data to display              03/02/2023    1:49 PM 08/05/2022    1:19 PM 01/30/2022    1:45 PM  Montreal Cognitive Assessment   Visuospatial/ Executive (0/5) 1 0 0  Naming (0/3) 2 0 2  Attention: Read list of digits (0/2) 2 2 2   Attention: Read list of letters (0/1) 0 1 0  Attention: Serial 7 subtraction starting at 100 (0/3) 1 2 3   Language: Repeat phrase (0/2) 1 2 1   Language : Fluency (0/1) 0 0 0  Abstraction (0/2) 1 1 2   Delayed Recall (0/5) 0 0 0  Orientation (0/6) 1 2 2   Total 9 10 12      ASSESSMENT AND PLAN  89 y.o. year old male  has a past medical history of Arthritis, BPH (benign prostatic hyperplasia), BPH (benign prostatic hyperplasia), Cataract, Dementia without behavioral disturbance (HCC) (12/30/2022), Diverticulosis, GERD (gastroesophageal reflux disease), Glaucoma, History of stroke, Hyperlipemia, Hypertension, Loss of appetite, MCI (mild cognitive impairment), Memory impairment, and Sleep apnea. here with    Moderate Alzheimer's dementia without behavioral disturbance, psychotic disturbance, mood disturbance, or anxiety, unspecified timing of dementia onset (HCC)  Cerebrovascular accident (CVA), unspecified mechanism (HCC)  Imbalance  Les Pou Casares Sr. Is doing fairly well, today. Memory seems stable and he is tolerating memantine. I have discussed increasing dose to 10mg  BID. Family wishes to think about about this. We have reviewed safety precautions and memory compensation strategies. Healthy lifestyle habits encouraged. He will follow up with PCP as directed. He will return to see me in 6 months, sooner if needed. He verbalizes understanding and agreement with this plan.   No orders of the defined types were placed  in this encounter.    Meds ordered this encounter  Medications   memantine (NAMENDA) 5 MG tablet     Sig: Take 1 tablet (5 mg total) by mouth 2 (two) times daily.    Dispense:  180 tablet    Refill:  1    Order Specific Question:   Supervising Provider    Answer:   Anson Fret [id number]    I spent 30 minutes of face-to-face and non-face-to-face time with patient.  This included previsit chart review, lab review, study review, order entry, electronic health record documentation, patient education.   Shawnie Dapper, MSN, FNP-C 03/02/2023, 3:33 PM  [facility] [address] [place], [place] [address] [phone]

## 2023-02-26 NOTE — Patient Instructions (Signed)
Below is our plan:  We will continue memantine 5mg  twice daily. We can consider increasing dose in the future if you wish. Goal dose would be 10mg  twice daily.   Please make sure you are staying well hydrated. I recommend 50-60 ounces daily. Well balanced diet and regular exercise encouraged. Consistent sleep schedule with 6-8 hours recommended.   Please continue follow up with care team as directed.   Follow up with me in 6 months  You may receive a survey regarding today's visit. I encourage you to leave honest feed back as I do use this information to improve patient care. Thank you for seeing me today!   Management of Memory Problems   There are some general things you can do to help manage your memory problems.  Your memory may not in fact recover, but by using techniques and strategies you will be able to manage your memory difficulties better.   1)  Establish a routine. Try to establish and then stick to a regular routine.  By doing this, you will get used to what to expect and you will reduce the need to rely on your memory.  Also, try to do things at the same time of day, such as taking your medication or checking your calendar first thing in the morning. Think about think that you can do as a part of a regular routine and make a list.  Then enter them into a daily planner to remind you.  This will help you establish a routine.   2)  Organize your environment. Organize your environment so that it is uncluttered.  Decrease visual stimulation.  Place everyday items such as keys or cell phone in the same place every day (ie.  Basket next to front door) Use post it notes with a brief message to yourself (ie. Turn off light, lock the door) Use labels to indicate where things go (ie. Which cupboards are for food, dishes, etc.) Keep a notepad and pen by the telephone to take messages   3)  Memory Aids A diary or journal/notebook/daily planner Making a list (shopping list, chore list, to  do list that needs to be done) Using an alarm as a reminder (kitchen timer or cell phone alarm) Using cell phone to store information (Notes, Calendar, Reminders) Calendar/White board placed in a prominent position Post-it notes   In order for memory aids to be useful, you need to have good habits.  It's no good remembering to make a note in your journal if you don't remember to look in it.  Try setting aside a certain time of day to look in journal.   4)  Improving mood and managing fatigue. There may be other factors that contribute to memory difficulties.  Factors, such as anxiety, depression and tiredness can affect memory. Regular gentle exercise can help improve your mood and give you more energy. Exercise: there are short videos created by the [employer] on Health specially for older adults: https://bit.ly/2I30q97.  Mediterranean diet: which emphasizes fruits, vegetables, whole grains, legumes, fish, and other seafood; unsaturated fats such as olive oils; and low amounts of red meat, eggs, and sweets. A variation of this, called MIND (Mediterranean-DASH Intervention for Neurodegenerative Delay) incorporates the DASH (Dietary Approaches to Stop Hypertension) diet, which has been shown to lower high blood pressure, a risk factor for Alzheimer's disease. More information at: ExitMarketing.de.  Aerobic exercise that improve heart health is also good for the mind.  [employer] on Aging have short  videos for exercises that you can do at home: GoldCloset.com.ee Simple relaxation techniques may help relieve symptoms of anxiety Try to get back to completing activities or hobbies you enjoyed doing in the past. Learn to pace yourself through activities to decrease fatigue. Find out about some local support groups where you can share experiences with others. Try and achieve 7-8 hours of sleep at  night.   Resources for Family/Caregiver  Online caregiver support groups can be found at LDLive.be or call Alzheimer's Association's 24/7 hotline: (470)350-8688. [facility] offers in-person, virtual support groups and individual counseling for both care partners and persons with memory loss. Call for more information at (907)386-1488.   Advanced care plan: there are two types of Power of Attorney: healthcare and durable. Healthcare POA is a designated person to make healthcare decisions on your behalf if you were too sick to make them yourself. This person can be selected and documented by your physician. Durable POA has to be set up with a lawyer who takes charge of your finances and estate if you were too sick or cognitively impaired to manage your finances accurately. You can find a local Elder Engineer, mining here: ToyShower.it.  Check out www.planyourlifespan.org, which will help you plan before a crisis and decide who will take care of life considerations in a circumstance where you may not be able to speak for yourself.   Helpful books (available on [employer] or your local bookstore):  By Dr. Army Chaco: Keeping Love Alive as Memories Fade: The 5 Love Languages and the Alzheimer's Journey Jul 28, 2015 The Dementia Care Partner's Workbook: A Guide for Understanding, Education, and [employer] - March 27, 2018.  Both available for less than $15.   "Coping with behavior change in dementia: a family caregiver's guide" by [place] "A Caregiver's Guide to Dementia: Using Activities and Other Strategies to Prevent, Reduce and Manage Behavioral Symptoms" by Osie Bond Gitlin and [employer].  Arts development officer of Joy for the Person with Alzheimer's or Dementia" 4th edition by Vance Peper  Caregiver videos on common behaviors related to dementia: quierodirigir.com  [place] Caregiver Portal: free to sign up,  links to local resources: https://-caregivers.com/login

## 2023-02-27 ENCOUNTER — Other Ambulatory Visit: Payer: Self-pay

## 2023-03-02 ENCOUNTER — Ambulatory Visit: Payer: PPO | Admitting: Family Medicine

## 2023-03-02 ENCOUNTER — Ambulatory Visit: Payer: PPO | Admitting: Psychiatry

## 2023-03-02 ENCOUNTER — Encounter: Payer: Self-pay | Admitting: Family Medicine

## 2023-03-02 VITALS — BP 137/87 | HR 65 | Ht 66.0 in | Wt 116.6 lb

## 2023-03-02 DIAGNOSIS — G309 Alzheimer's disease, unspecified: Secondary | ICD-10-CM

## 2023-03-02 DIAGNOSIS — I639 Cerebral infarction, unspecified: Secondary | ICD-10-CM

## 2023-03-02 DIAGNOSIS — R2689 Other abnormalities of gait and mobility: Secondary | ICD-10-CM

## 2023-03-02 DIAGNOSIS — F02B0 Dementia in other diseases classified elsewhere, moderate, without behavioral disturbance, psychotic disturbance, mood disturbance, and anxiety: Secondary | ICD-10-CM

## 2023-03-02 MED ORDER — MEMANTINE HCL 5 MG PO TABS: 5.0000 mg | ORAL_TABLET | Freq: Two times a day (BID) | ORAL | 1 refills | Status: DC

## 2023-04-01 ENCOUNTER — Other Ambulatory Visit: Payer: Self-pay

## 2023-04-01 ENCOUNTER — Other Ambulatory Visit: Payer: Self-pay | Admitting: Psychiatry

## 2023-05-11 ENCOUNTER — Ambulatory Visit (INDEPENDENT_AMBULATORY_CARE_PROVIDER_SITE_OTHER): Payer: PPO | Admitting: Family

## 2023-05-11 ENCOUNTER — Telehealth: Payer: Self-pay

## 2023-05-11 ENCOUNTER — Encounter: Payer: Self-pay | Admitting: Family

## 2023-05-11 VITALS — BP 136/82 | HR 67 | Temp 98.2°F | Resp 18 | Ht 66.0 in | Wt 110.0 lb

## 2023-05-11 DIAGNOSIS — R634 Abnormal weight loss: Secondary | ICD-10-CM

## 2023-05-11 DIAGNOSIS — G309 Alzheimer's disease, unspecified: Secondary | ICD-10-CM | POA: Diagnosis not present

## 2023-05-11 DIAGNOSIS — R531 Weakness: Secondary | ICD-10-CM | POA: Diagnosis not present

## 2023-05-11 DIAGNOSIS — F0280 Dementia in other diseases classified elsewhere without behavioral disturbance: Secondary | ICD-10-CM

## 2023-05-11 MED ORDER — ENSURE ACTIVE HIGH PROTEIN PO LIQD: 1.0000 | Freq: Every day | ORAL | 5 refills | Status: DC

## 2023-05-11 MED ORDER — MIRTAZAPINE 15 MG PO TABS: 15.0000 mg | ORAL_TABLET | Freq: Every day | ORAL | 1 refills | Status: DC

## 2023-05-11 NOTE — Progress Notes (Signed)
Provider: Richarda Blade FNP-C  Woodie Degraffenreid, [name], NP  Patient Care Team: Zamyah Wiesman, [name], NP as PCP - General (Family Medicine)  Extended Emergency Contact Information Primary Emergency Contact: Plato,[name] Address: [address]          [place], [place] [address] [place] Home Phone: [phone] Mobile Phone: [phone] Relation: Son Secondary Emergency Contact: Arduini,[name] Mobile Phone: [phone] Relation: Grandson Preferred language: English Interpreter needed? No  Code Status:  Full Code  Goals of care: Advanced Directive information    01/21/2023    2:28 PM  Advanced Directives  Does Patient Have a Medical Advance Directive? Yes  Type of Estate agent of [place];Living will  Does patient want to make changes to medical advance directive? No - Patient declined  Copy of Healthcare Power of Attorney in Chart? Yes - validated most recent copy scanned in chart (See row information)  Would patient like information on creating a medical advance directive? No - Patient declined     Chief Complaint  Patient presents with   Acute Visit    Patient is here for generalized weakness and lack of appetite    HPI:  Pt is a 89 y.o. male seen today for an acute visit for evaluation of weight loss,generalized weakness and decreased appetite.He is here with son who provides HPI information.States patient sometimes refuses meals.Also refuses to take medication at times.Tends to hold saliva then spits in the Trush can.HPI information limited due to patient answering yes and no at times quiet during visit.   Past Medical History:  Diagnosis Date   Arthritis    hp bone,knees,hands,shoulders   BPH (benign prostatic hyperplasia)    BPH (benign prostatic hyperplasia)    Cataract    Dementia without behavioral disturbance (HCC) 12/30/2022   Diverticulosis    GERD (gastroesophageal reflux disease)    Glaucoma    History of stroke     Hyperlipemia    Hypertension    Loss of appetite    MCI (mild cognitive impairment)    Memory impairment    Sleep apnea    Past Surgical History:  Procedure Laterality Date   BUNIONECTOMY Left 05/16/2016   CATARACT EXTRACTION Bilateral 02/2013   TRANSURETHRAL RESECTION OF PROSTATE  1997    Allergies  Allergen Reactions   Donepezil     Outpatient Encounter Medications as of 05/11/2023  Medication Sig   aspirin EC 81 MG tablet Take 81 mg by mouth daily. Swallow whole.   atorvastatin (LIPITOR) 40 MG tablet Take 1 tablet by mouth once daily   Cholecalciferol (D3 ADULT PO) Take 1 tablet by mouth daily.   esomeprazole (NEXIUM) 20 MG capsule Take 1 capsule (20 mg total) by mouth daily as needed (for acid reflux).   loratadine (CLARITIN) 10 MG tablet Take 10 mg by mouth daily. Take one tablet daily   losartan (COZAAR) 50 MG tablet Take 1 tablet (50 mg total) by mouth daily.   memantine (NAMENDA) 5 MG tablet Take 1 tablet (5 mg total) by mouth 2 (two) times daily.   mirtazapine (REMERON) 7.5 MG tablet Take 1 tablet (7.5 mg total) by mouth at bedtime. For appetite   Multiple Vitamins-Minerals (ONE DAILY COMPLETE FOR MEN) TABS Take 1 tablet by mouth daily.   No facility-administered encounter medications on file as of 05/11/2023.    Review of Systems  Constitutional:  Negative for appetite change, chills, fatigue, fever and unexpected weight change.  HENT:  Negative for congestion, dental problem,  ear discharge, ear pain, facial swelling, hearing loss, nosebleeds, postnasal drip, rhinorrhea, sinus pressure, sinus pain, sneezing, sore throat and tinnitus.        Spits out food   Eyes:  Negative for pain, discharge, redness, itching and visual disturbance.  Respiratory:  Negative for cough, chest tightness, shortness of breath and wheezing.   Cardiovascular:  Negative for chest pain, palpitations and leg swelling.  Gastrointestinal:  Negative for abdominal distention, abdominal pain,  constipation, diarrhea, nausea and vomiting.  Endocrine: Negative for cold intolerance, heat intolerance, polydipsia, polyphagia and polyuria.  Genitourinary:  Negative for difficulty urinating, dysuria, flank pain, frequency and urgency.  Musculoskeletal:  Negative for arthralgias, back pain, gait problem, joint swelling, myalgias, neck pain and neck stiffness.  Skin:  Negative for color change, pallor, rash and wound.  Neurological:  Negative for dizziness, syncope, speech difficulty, weakness, light-headedness, numbness and headaches.  Hematological:  Does not bruise/bleed easily.  Psychiatric/Behavioral:  Negative for agitation, behavioral problems, confusion, hallucinations and sleep disturbance. The patient is not nervous/anxious.     Immunization History  Administered Date(s) Administered   Covid-19, Mrna,Vaccine(Spikevax)24yrs and older 08/03/2022   Influenza, High Dose Seasonal PF 07/15/2022   PFIZER Comirnaty(Gray Top)Covid-19 Tri-Sucrose Vaccine 07/21/2020   PFIZER(Purple Top)SARS-COV-2 Vaccination 11/21/2019, 12/12/2019   Pfizer Covid-19 Vaccine Bivalent Booster 7yrs & up 09/23/2021   Pneumococcal Conjugate-13 12/09/2017, 05/27/2018   Pneumococcal Polysaccharide-23 04/18/2021   Rsv, Bivalent, Protein Subunit Rsvpref,pf Verdis Frederickson) 07/15/2022   Tdap 04/17/2018   Zoster Recombinant(Shingrix) 08/20/2018, 04/18/2021   Pertinent  Health Maintenance Due  Topic Date Due   INFLUENZA VACCINE  05/28/2023      10/25/2021    8:29 AM 12/10/2022    9:30 AM 01/02/2023    2:08 PM 01/21/2023    2:27 PM  Fall Risk  Falls in the past year?  0 0 0  Was there an injury with Fall?  0 0 0  Fall Risk Category Calculator  0 0 0  (RETIRED) Patient Fall Risk Level Low fall risk     Patient at Risk for Falls Due to  No Fall Risks No Fall Risks   Fall risk Follow up  Falls evaluation completed Falls evaluation completed Falls evaluation completed;Education provided;Falls prevention discussed    Functional Status Survey:    Vitals:   05/11/23 1519  BP: 136/82  Pulse: 67  Resp: 18  Temp: 98.2 F (36.8 C)  SpO2: 97%  Weight: 110 lb (49.9 kg)  Height: 5\' 6"  (1.676 m)   Body mass index is 17.75 kg/m. Physical Exam Vitals reviewed.  Constitutional:      General: He is not in acute distress.    Appearance: Normal appearance. He is underweight. He is not ill-appearing or diaphoretic.  HENT:     Head: Normocephalic.     Right Ear: Tympanic membrane, ear canal and external ear normal. There is no impacted cerumen.     Left Ear: Tympanic membrane, ear canal and external ear normal. There is no impacted cerumen.     Nose: Nose normal. No congestion or rhinorrhea.     Mouth/Throat:     Mouth: Mucous membranes are moist.     Pharynx: Oropharynx is clear. No oropharyngeal exudate or posterior oropharyngeal erythema.  Eyes:     General: No scleral icterus.       Right eye: No discharge.        Left eye: No discharge.     Extraocular Movements: Extraocular movements intact.     Conjunctiva/sclera: Conjunctivae  normal.     Pupils: Pupils are equal, round, and reactive to light.  Neck:     Vascular: No carotid bruit.  Cardiovascular:     Rate and Rhythm: Normal rate and regular rhythm.     Pulses: Normal pulses.     Heart sounds: Normal heart sounds. No murmur heard.    No friction rub. No gallop.  Pulmonary:     Effort: Pulmonary effort is normal. No respiratory distress.     Breath sounds: Normal breath sounds. No wheezing, rhonchi or rales.  Chest:     Chest wall: No tenderness.  Abdominal:     General: Bowel sounds are normal. There is no distension.     Palpations: Abdomen is soft. There is no mass.     Tenderness: There is no abdominal tenderness. There is no right CVA tenderness, left CVA tenderness, guarding or rebound.  Musculoskeletal:        General: No swelling or tenderness. Normal range of motion.     Cervical back: Normal range of motion. No rigidity or  tenderness.     Right lower leg: No edema.     Left lower leg: No edema.  Lymphadenopathy:     Cervical: No cervical adenopathy.  Skin:    General: Skin is warm and dry.     Coloration: Skin is not pale.     Findings: No bruising, erythema, lesion or rash.  Neurological:     Mental Status: He is alert and oriented to person, place, and time.     Cranial Nerves: No cranial nerve deficit.     Sensory: No sensory deficit.     Motor: No weakness.     Coordination: Coordination normal.     Gait: Gait abnormal.  Psychiatric:        Mood and Affect: Mood normal.        Speech: Speech normal.        Behavior: Behavior normal.        Thought Content: Thought content normal.        Judgment: Judgment normal.     Comments: Holds saliva in mouth and spits in the Trush     Labs reviewed: Recent Labs    12/30/22 0122 12/31/22 0747 01/02/23 1450  NA 141 137 140  K 3.7 3.9 4.0  CL 106 106 106  CO2 26 25 28   GLUCOSE 109* 89 95  BUN 14 10 9   CREATININE 1.34* 0.96 1.03  CALCIUM 10.7* 10.0 10.4*   Recent Labs    12/04/22 1520 12/30/22 0122 01/02/23 1450  AST 11 20 32  ALT 6* 10 14  ALKPHOS  --  69  --   BILITOT 0.7 0.8 0.3  PROT 6.9 6.5 6.3  ALBUMIN  --  3.5  --    Recent Labs    12/04/22 1520 12/30/22 0122 01/02/23 1450  WBC 4.2 6.5 3.6*  NEUTROABS 1,999  --  1,501  HGB 13.9 13.1 12.3*  HCT 40.2 37.3* 35.5*  MCV 98.3 95.9 96.2  PLT 237 187 198   Lab Results  Component Value Date   TSH 1.75 12/04/2022   Lab Results  Component Value Date   HGBA1C 5.6 08/05/2022   Lab Results  Component Value Date   CHOL 124 12/04/2022   HDL 54 12/04/2022   LDLCALC 57 12/04/2022   TRIG 58 12/04/2022   CHOLHDL 2.3 12/04/2022    Significant Diagnostic Results in last 30 days:  No results found.  Assessment/Plan  1. Weight loss, abnormal Has had 7 lbs over 2 months - Encouraged protein supplement  - will decrease Remeron from 15 mg to 7.5 mg tablet due to patient  sleeping all the time. - follow up in one month for evaluation   2. Alzheimer's dementia without behavioral disturbance (HCC) Holds saliva and spits out food during meals.Resisting care  - continue on memantine  - add divalproex for behavioral issues.  3. Generalized weakness S/P ED visit for dehydration. - will check urine for UTI   Family/ staff Communication: Reviewed plan of care with patient and son verbalized understanding   Labs/tests ordered: Urine culture   Next Appointment: Return in about 1 month (around 06/11/2023) for weight loss .   Caesar Bookman, NP

## 2023-05-11 NOTE — Telephone Encounter (Signed)
Incoming fax received from patients pharmacy to initiate a prior authorization for                   .  PA initiated through covermymeds. Key: [id number]  Awaiting reply from the insurance company which will be determined in 48-72 hours.

## 2023-05-12 ENCOUNTER — Emergency Department (HOSPITAL_COMMUNITY): Payer: PPO

## 2023-05-12 ENCOUNTER — Encounter (HOSPITAL_COMMUNITY): Payer: Self-pay

## 2023-05-12 ENCOUNTER — Emergency Department (HOSPITAL_COMMUNITY)
Admission: EM | Admit: 2023-05-12 | Discharge: 2023-05-13 | Disposition: A | Discharge status: Home / Self Care | Payer: PPO | Attending: Emergency Medicine | Admitting: Emergency Medicine

## 2023-05-12 ENCOUNTER — Other Ambulatory Visit: Payer: Self-pay

## 2023-05-12 DIAGNOSIS — R55 Syncope and collapse: Secondary | ICD-10-CM | POA: Insufficient documentation

## 2023-05-12 DIAGNOSIS — E860 Dehydration: Secondary | ICD-10-CM | POA: Diagnosis not present

## 2023-05-12 DIAGNOSIS — Z79899 Other long term (current) drug therapy: Secondary | ICD-10-CM | POA: Diagnosis not present

## 2023-05-12 DIAGNOSIS — F0390 Unspecified dementia without behavioral disturbance: Secondary | ICD-10-CM | POA: Insufficient documentation

## 2023-05-12 DIAGNOSIS — Z7982 Long term (current) use of aspirin: Secondary | ICD-10-CM | POA: Diagnosis not present

## 2023-05-12 DIAGNOSIS — I10 Essential (primary) hypertension: Secondary | ICD-10-CM | POA: Diagnosis not present

## 2023-05-12 LAB — CBC WITH DIFFERENTIAL/PLATELET
Abs Immature Granulocytes: 0.03 10*3/uL (ref 0.00–0.07)
Basophils Absolute: 0 10*3/uL (ref 0.0–0.1)
Basophils Relative: 0 %
Eosinophils Absolute: 0 10*3/uL (ref 0.0–0.5)
Eosinophils Relative: 0 %
HCT: 36.5 % — ABNORMAL LOW (ref 39.0–52.0)
Hemoglobin: 12.2 g/dL — ABNORMAL LOW (ref 13.0–17.0)
Immature Granulocytes: 0 %
Lymphocytes Relative: 7 %
Lymphs Abs: 0.8 10*3/uL (ref 0.7–4.0)
MCH: 33.1 pg (ref 26.0–34.0)
MCHC: 33.4 g/dL (ref 30.0–36.0)
MCV: 98.9 fL (ref 80.0–100.0)
Monocytes Absolute: 0.5 10*3/uL (ref 0.1–1.0)
Monocytes Relative: 5 %
Neutro Abs: 9.1 10*3/uL — ABNORMAL HIGH (ref 1.7–7.7)
Neutrophils Relative %: 88 %
Platelets: 241 10*3/uL (ref 150–400)
RBC: 3.69 MIL/uL — ABNORMAL LOW (ref 4.22–5.81)
RDW: 13.1 % (ref 11.5–15.5)
WBC: 10.4 10*3/uL (ref 4.0–10.5)
nRBC: 0 % (ref 0.0–0.2)

## 2023-05-12 LAB — COMPREHENSIVE METABOLIC PANEL
ALT: 10 U/L (ref 0–44)
AST: 21 U/L (ref 15–41)
Albumin: 3.6 g/dL (ref 3.5–5.0)
Alkaline Phosphatase: 72 U/L (ref 38–126)
Anion gap: 12 (ref 5–15)
BUN: 11 mg/dL (ref 8–23)
CO2: 25 mmol/L (ref 22–32)
Calcium: 10.7 mg/dL — ABNORMAL HIGH (ref 8.9–10.3)
Chloride: 105 mmol/L (ref 98–111)
Creatinine, Ser: 1.36 mg/dL — ABNORMAL HIGH (ref 0.61–1.24)
GFR, Estimated: 50 mL/min — ABNORMAL LOW (ref >60)
Glucose, Bld: 120 mg/dL — ABNORMAL HIGH (ref 70–99)
Potassium: 3.9 mmol/L (ref 3.5–5.1)
Sodium: 142 mmol/L (ref 135–145)
Total Bilirubin: 0.4 mg/dL (ref 0.3–1.2)
Total Protein: 6.8 g/dL (ref 6.5–8.1)

## 2023-05-12 LAB — TROPONIN I (HIGH SENSITIVITY)
Troponin I (High Sensitivity): 10 ng/L (ref <18)
Troponin I (High Sensitivity): 12 ng/L (ref <18)

## 2023-05-12 MED ORDER — SODIUM CHLORIDE 0.9 % IV BOLUS
1000.0000 mL | Freq: Once | INTRAVENOUS | Status: AC
  Administered 2023-05-12: 1000 mL via INTRAVENOUS

## 2023-05-12 MED ORDER — SODIUM CHLORIDE 0.9 % IV BOLUS: 500.0000 mL | Freq: Once | INTRAVENOUS | Status: DC

## 2023-05-12 NOTE — ED Provider Notes (Signed)
[place] EMERGENCY DEPARTMENT AT [facility] Provider Note   CSN: [id number] Arrival date & time: 05/12/23  1928     History  Chief Complaint  Patient presents with   Loss of Consciousness    Jason Castaneda Sr. is a 89 y.o. male.   Loss of Consciousness Patient with history of dementia.  Reportedly found unresponsive on the toilet.  Reportedly hypotensive.  Reportedly was unresponsive for 3 hours.  Reportedly had dark stools by EMS.  Initial hypotension has improved.  EMS states that family says the patient is normally alert and oriented x 4, however neurology notes appear that there is a moderate dementia at baseline.  Patient cannot really provide much history.    Past Medical History:  Diagnosis Date   Arthritis    hp bone,knees,hands,shoulders   BPH (benign prostatic hyperplasia)    BPH (benign prostatic hyperplasia)    Cataract    Dementia without behavioral disturbance (HCC) 12/30/2022   Diverticulosis    GERD (gastroesophageal reflux disease)    Glaucoma    History of stroke    Hyperlipemia    Hypertension    Loss of appetite    MCI (mild cognitive impairment)    Memory impairment    Sleep apnea     Home Medications Prior to Admission medications   Medication Sig Start Date End Date Taking? Authorizing Provider  aspirin EC 81 MG tablet Take 81 mg by mouth daily. Swallow whole.   Yes [provider]  atorvastatin (LIPITOR) 40 MG tablet Take 1 tablet by mouth once daily 04/01/23  Yes Chima, Victorino Dike, MD  Cholecalciferol (D3 ADULT PO) Take 1 tablet by mouth daily.   Yes [provider]  loratadine (CLARITIN) 10 MG tablet Take 10 mg by mouth daily as needed for allergies. Take one tablet daily   Yes [provider]  losartan (COZAAR) 50 MG tablet Take 1 tablet (50 mg total) by mouth daily. 02/12/23  Yes Ngetich, Dinah C, NP  memantine (NAMENDA) 5 MG tablet Take 1 tablet (5 mg total) by mouth 2 (two) times daily. 03/02/23  Yes  Lomax, Amy, NP  mirtazapine (REMERON) 15 MG tablet Take 1 tablet (15 mg total) by mouth at bedtime. For appetite 05/11/23  Yes Ngetich, Dinah C, NP  Multiple Vitamins-Minerals (ONE DAILY COMPLETE FOR MEN) TABS Take 1 tablet by mouth daily.   Yes [provider]  Nutritional Supplements (ENSURE ACTIVE HIGH PROTEIN) LIQD Take 1 Can by mouth daily. 05/11/23  Yes Ngetich, Dinah C, NP  esomeprazole (NEXIUM) 20 MG capsule Take 1 capsule (20 mg total) by mouth daily as needed (for acid reflux). Patient not taking: Reported on 05/12/2023 12/31/22   Zigmund Daniel., MD      Allergies    Donepezil    Review of Systems   Review of Systems  Cardiovascular:  Positive for syncope.    Physical Exam Updated Vital Signs BP 124/75   Pulse 71   Temp 98.3 F (36.8 C)   Resp 13   SpO2 99%  Physical Exam Vitals reviewed.  Eyes:     Pupils: Pupils are equal, round, and reactive to light.  Cardiovascular:     Rate and Rhythm: Regular rhythm.  Chest:     Chest wall: No tenderness.  Abdominal:     Tenderness: There is no abdominal tenderness.  Musculoskeletal:     Cervical back: Neck supple.  Skin:    Capillary Refill: Capillary refill takes less than 2  seconds.  Neurological:     Mental Status: He is alert.     Comments: Awake and is able to tell me his name.  He is able to tell me that he lives in [place] knows he is in the hospital but cannot say which 1.  Appears mildly confused.     ED Results / Procedures / Treatments   Labs (all labs ordered are listed, but only abnormal results are displayed) Labs Reviewed  CBC WITH DIFFERENTIAL/PLATELET - Abnormal; Notable for the following components:      Result Value   RBC 3.69 (*)    Hemoglobin 12.2 (*)    HCT 36.5 (*)    Neutro Abs 9.1 (*)    All other components within normal limits  COMPREHENSIVE METABOLIC PANEL - Abnormal; Notable for the following components:   Glucose, Bld 120 (*)    Creatinine, Ser 1.36 (*)    Calcium  10.7 (*)    GFR, Estimated 50 (*)    All other components within normal limits  POC OCCULT BLOOD, ED  TROPONIN I (HIGH SENSITIVITY)  TROPONIN I (HIGH SENSITIVITY)    EKG EKG Interpretation Date/Time:  Tuesday May 12 2023 20:04:53 EDT Ventricular Rate:  80 PR Interval:  186 QRS Duration:  97 QT Interval:  355 QTC Calculation: 410 R Axis:   73  Text Interpretation: Sinus rhythm Confirmed by Benjiman Core [phone]) on 05/12/2023 10:23:47 PM  Radiology CT HEAD WO CONTRAST ( )  Result Date: 05/12/2023 CLINICAL DATA:  Mental status change, unknown cause EXAM: CT HEAD WITHOUT CONTRAST TECHNIQUE: Contiguous axial images were obtained from the base of the skull through the vertex without intravenous contrast. RADIATION DOSE REDUCTION: This exam was performed according to the departmental dose-optimization program which includes automated exposure control, adjustment of the mA and/or kV according to patient size and/or use of iterative reconstruction technique. COMPARISON:  CT Head 02/05/22 FINDINGS: Brain: No evidence of acute infarction, hemorrhage, hydrocephalus, extra-axial collection or mass lesion/mass effect. Chronic right temporal lobe infarct. Cavum septum pellucidum et vergae Vascular: No hyperdense vessel or unexpected calcification. Skull: Normal. Negative for fracture or focal lesion. Sinuses/Orbits: No middle ear or mastoid effusion. Paranasal sinuses are clear. Bilateral lens replacement. Orbits are otherwise unremarkable. Other: None. IMPRESSION: No CT finding to explain altered mental status Electronically Signed   By: Lorenza Cambridge M.D.   On: 05/12/2023 21:11    Procedures Procedures    Medications Ordered in ED Medications  sodium chloride 0.9 % bolus 1,000 mL (1,000 mLs Intravenous New Bag/Given 05/12/23 2151)    ED Course/ Medical Decision Making/ A&P                             Medical Decision Making Amount and/or Complexity of Data Reviewed Labs:  ordered. Radiology: ordered.    patient with reported syncope.  Reported had hypotension.  Reportedly was unresponsive for 3 hours.  RecentlyHas had some diarrhea.  Reportedly been having some difficulty swallowing.  Did have some reportedly dark stool.  Reviewed notes does have moderate dementia.  I have discussed with the patient's son who is at the bedside.  He states is worried about another stroke.  Blood work does show elevated creatinine.  Likely some dehydration.  Hemoglobin is stable from before.  However reviewed notes and imaging and has had previous stroke.  Potentially could cause some of the difficulty swallowing.  Will get MRI to evaluate.  Hopefully should be able  to discharge home after some hydration.  Care turned over to Dr. Oletta Cohn.         Final Clinical Impression(s) / ED Diagnoses Final diagnoses:  Dehydration  Syncope, unspecified syncope type    Rx / DC Orders ED Discharge Orders     None         Benjiman Core, MD 05/12/23 2308

## 2023-05-12 NOTE — ED Triage Notes (Signed)
Pt was unresponsive on the toilet for three hours per his son. He was hypotensive with EMS. He had dark tarry stools in the toilet. No abdominal tenderness. Patient is a little confused. Per family he is A&Ox4. He has a 20G.

## 2023-05-12 NOTE — Telephone Encounter (Signed)
Notification received from covermymeds indicating more information is needed to process PA. 3 more questions were answered and the information was sent to [employer]     Awaiting reply

## 2023-05-13 ENCOUNTER — Encounter: Payer: Self-pay | Admitting: Family

## 2023-05-13 NOTE — Transitions of Care (Post Inpatient/ED Visit) (Signed)
   05/13/2023  Name: Jason WHITEHURST Sr. MRN: [id number] DOB: 12-31-1930  Today's TOC FU Call Status: Today's TOC FU Call Status:: Successful TOC FU Call Competed TOC FU Call Complete Date: 05/13/23  Transition Care Management Follow-up Telephone Call Date of Discharge: 05/13/23 Discharge Facility: [facility] [place]) Type of Discharge: Emergency Department How have you been since you were released from the hospital?: Same Any questions or concerns?: Yes Patient Questions/Concerns:: right arm swolled and sore from vein blowout from the IV Patient Questions/Concerns Addressed: Notified Provider of Patient Questions/Concerns  Items Reviewed: Did you receive and understand the discharge instructions provided?: Yes Medications obtained,verified, and reconciled?: Yes (Medications Reviewed) Any new allergies since your discharge?: No Dietary orders reviewed?: NA Do you have support at home?: Yes People in Home: child(ren), adult  Medications Reviewed Today: Medications Reviewed Today     Reviewed by Hale Bogus, CPhT (Pharmacy Technician) on 05/12/23 at 2014  Med List Status: Complete   Medication Order Taking? Sig Documenting Provider Last Dose Status Informant  aspirin EC 81 MG tablet [id number] Yes Take 81 mg by mouth daily. Swallow whole. [provider] 05/12/2023 Active Child  atorvastatin (LIPITOR) 40 MG tablet [id number] Yes Take 1 tablet by mouth once daily Ocie Doyne, MD 05/11/2023 Active Child  Cholecalciferol (D3 ADULT PO) [id number] Yes Take 1 tablet by mouth daily. [provider] 05/12/2023 Active Child  esomeprazole (NEXIUM) 20 MG capsule [id number] No Take 1 capsule (20 mg total) by mouth daily as needed (for acid reflux).  Patient not taking: Reported on 05/12/2023   Zigmund Daniel., MD Not Taking Active Child  loratadine (CLARITIN) 10 MG tablet [id number] Yes Take 10 mg by mouth daily as needed for allergies. Take one tablet daily [provider] unk Active Child  losartan (COZAAR) 50 MG tablet [id number] Yes Take 1 tablet (50 mg total) by mouth daily. Ngetich, Dinah C, NP 05/12/2023 Active Child  memantine (NAMENDA) 5 MG tablet [id number] Yes Take 1 tablet (5 mg total) by mouth 2 (two) times daily. Lomax, Amy, NP 05/12/2023 Active Child  mirtazapine (REMERON) 15 MG tablet [id number] Yes Take 1 tablet (15 mg total) by mouth at bedtime. For appetite Ngetich, Dinah C, NP 05/11/2023 Active Child  Multiple Vitamins-Minerals (ONE DAILY COMPLETE FOR MEN) TABS [id number] Yes Take 1 tablet by mouth daily. [provider] 05/12/2023 Active Child  Nutritional Supplements (ENSURE ACTIVE HIGH PROTEIN) LIQD [id number] Yes Take 1 Can by mouth daily. Ngetich, Dinah C, NP 05/12/2023 Active Child            Home Care and Equipment/Supplies: Were Home Health Services Ordered?: NA Any new equipment or medical supplies ordered?: NA  Functional Questionnaire: Do you need assistance with bathing/showering or dressing?: No Do you need assistance with meal preparation?: Yes Do you need assistance with eating?: No Do you have difficulty maintaining continence: No Do you need assistance with getting out of bed/getting out of a chair/moving?: Yes Do you have difficulty managing or taking your medications?: Yes  Follow up appointments reviewed: PCP Follow-up appointment confirmed?: Yes Date of PCP follow-up appointment?: 05/14/23 Follow-up Provider: Richarda Blade, NP Specialist Hospital Follow-up appointment confirmed?: NA Do you need transportation to your follow-up appointment?: No Do you understand care options if your condition(s) worsen?: Yes-patient verbalized understanding    SIGNATURE: Ayanah Snader CCMA,CMAA.

## 2023-05-13 NOTE — ED Provider Notes (Signed)
Signed out to me by Dr. Rubin Payor.  Patient with generalized weakness, likely dehydration.  MRI pending at time of signout.  MRI has been performed and does not show any acute stroke.  Discussed findings with patient's son, patient will be discharged.  Patient has a son that lives with him and is comfortable with him going home.   Gilda Crease, MD 05/13/23 [phone]

## 2023-05-14 ENCOUNTER — Ambulatory Visit (INDEPENDENT_AMBULATORY_CARE_PROVIDER_SITE_OTHER): Payer: PPO | Admitting: Family

## 2023-05-14 ENCOUNTER — Encounter: Payer: Self-pay | Admitting: Family

## 2023-05-14 VITALS — BP 120/64 | HR 78 | Temp 98.0°F | Resp 16 | Ht 66.0 in | Wt 110.0 lb

## 2023-05-14 DIAGNOSIS — R350 Frequency of micturition: Secondary | ICD-10-CM

## 2023-05-14 DIAGNOSIS — R1312 Dysphagia, oropharyngeal phase: Secondary | ICD-10-CM

## 2023-05-14 DIAGNOSIS — G309 Alzheimer's disease, unspecified: Secondary | ICD-10-CM | POA: Diagnosis not present

## 2023-05-14 DIAGNOSIS — F0280 Dementia in other diseases classified elsewhere without behavioral disturbance: Secondary | ICD-10-CM

## 2023-05-14 DIAGNOSIS — R634 Abnormal weight loss: Secondary | ICD-10-CM

## 2023-05-14 DIAGNOSIS — E860 Dehydration: Secondary | ICD-10-CM

## 2023-05-14 LAB — POCT URINALYSIS DIPSTICK
Bilirubin, UA: NEGATIVE
Glucose, UA: NEGATIVE
Ketones, UA: 40
Nitrite, UA: POSITIVE
Protein, UA: POSITIVE — AB
Spec Grav, UA: >=1.03 — AB (ref 1.010–1.025)
Urobilinogen, UA: 0.2 E.U./dL
pH, UA: 6.5 (ref 5.0–8.0)

## 2023-05-14 MED ORDER — MIRTAZAPINE 7.5 MG PO TABS
7.5000 mg | ORAL_TABLET | Freq: Every day | ORAL | 0 refills | Status: DC
Start: 2023-05-14 — End: 2024-02-02

## 2023-05-14 MED ORDER — DIVALPROEX SODIUM 125 MG PO DR TAB
125.0000 mg | DELAYED_RELEASE_TABLET | Freq: Two times a day (BID) | ORAL | 1 refills | Status: DC
Start: 2023-05-14 — End: 2024-02-02

## 2023-05-14 NOTE — Progress Notes (Signed)
Provider: Richarda Blade FNP-C  Marlys Stegmaier, [name], NP  Patient Care Team: Tsion Inghram, [name], NP as PCP - General (Family Medicine)  Extended Emergency Contact Information Primary Emergency Contact: Honor,[name] Address: [address]          [place], [place] [address] [place] Home Phone: [phone] Mobile Phone: [phone] Relation: Son Secondary Emergency Contact: Pett,[name] Mobile Phone: [phone] Relation: Grandson Preferred language: English Interpreter needed? No  Code Status:  Full Code  Goals of care: Advanced Directive information    05/13/2023    1:55 PM  Advanced Directives  Does Patient Have a Medical Advance Directive? Yes  Type of Advance Directive Healthcare Power of Attorney  Does patient want to make changes to medical advance directive? No - Patient declined  Copy of Healthcare Power of Attorney in Chart? Yes - validated most recent copy scanned in chart (See row information)     Chief Complaint  Patient presents with   Transitions Of Care    Patient is here for ED F/U for weakness, extreme fatigue, weight loss, and dehydration Patient is also refusing to eat or swallow food when fed    HPI:  Pt is a 89 y.o. male seen today for an acute visit for ED follow up generalized weakness / fatigue and issues with swallowing.He is here with son who providers lack of appetite. Lost balance and lower himself down. Had diarrhea several times.  He states no change in condition since he was discharged home.  Had MRI done which showed no acute abnormalities. Son states patient not eating usually hold food in the mouth then spits it out.spiting in the trash can during visit.  He denies any fever,chills,  Past Medical History:  Diagnosis Date   Arthritis    hp bone,knees,hands,shoulders   BPH (benign prostatic hyperplasia)    BPH (benign prostatic hyperplasia)    Cataract    Dementia without behavioral disturbance (HCC) 12/30/2022    Diverticulosis    GERD (gastroesophageal reflux disease)    Glaucoma    History of stroke    Hyperlipemia    Hypertension    Loss of appetite    MCI (mild cognitive impairment)    Memory impairment    Sleep apnea    Past Surgical History:  Procedure Laterality Date   BUNIONECTOMY Left 05/16/2016   CATARACT EXTRACTION Bilateral 02/2013   TRANSURETHRAL RESECTION OF PROSTATE  1997    Allergies  Allergen Reactions   Donepezil     Outpatient Encounter Medications as of 05/14/2023  Medication Sig   aspirin EC 81 MG tablet Take 81 mg by mouth daily. Swallow whole.   atorvastatin (LIPITOR) 40 MG tablet Take 1 tablet by mouth once daily   Cholecalciferol (D3 ADULT PO) Take 1 tablet by mouth daily.   loratadine (CLARITIN) 10 MG tablet Take 10 mg by mouth daily as needed for allergies. Take one tablet daily   losartan (COZAAR) 50 MG tablet Take 1 tablet (50 mg total) by mouth daily.   memantine (NAMENDA) 5 MG tablet Take 1 tablet (5 mg total) by mouth 2 (two) times daily.   mirtazapine (REMERON) 15 MG tablet Take 1 tablet (15 mg total) by mouth at bedtime. For appetite   Multiple Vitamins-Minerals (ONE DAILY COMPLETE FOR MEN) TABS Take 1 tablet by mouth daily.   Nutritional Supplements (ENSURE ACTIVE HIGH PROTEIN) LIQD Take 1 Can by mouth daily.   esomeprazole (NEXIUM) 20 MG capsule Take 1 capsule (20 mg total) by  mouth daily as needed (for acid reflux). (Patient not taking: Reported on 05/13/2023)   No facility-administered encounter medications on file as of 05/14/2023.    Review of Systems  Constitutional:  Positive for fatigue.  Musculoskeletal:  Positive for gait problem.  Neurological:        Holding sylvia in the mouth and spitting in the trash can during visit     Immunization History  Administered Date(s) Administered   Covid-19, Mrna,Vaccine(Spikevax)43yrs and older 08/03/2022   Influenza, High Dose Seasonal PF 07/15/2022   PFIZER Comirnaty(Gray Top)Covid-19 Tri-Sucrose  Vaccine 07/21/2020   PFIZER(Purple Top)SARS-COV-2 Vaccination 11/21/2019, 12/12/2019   Pfizer Covid-19 Vaccine Bivalent Booster 81yrs & up 09/23/2021   Pneumococcal Conjugate-13 12/09/2017, 05/27/2018   Pneumococcal Polysaccharide-23 04/18/2021   Rsv, Bivalent, Protein Subunit Rsvpref,pf Verdis Frederickson) 07/15/2022   Tdap 04/17/2018   Zoster Recombinant(Shingrix) 08/20/2018, 04/18/2021   Pertinent  Health Maintenance Due  Topic Date Due   INFLUENZA VACCINE  05/28/2023      10/25/2021    8:29 AM 12/10/2022    9:30 AM 01/02/2023    2:08 PM 01/21/2023    2:27 PM 05/13/2023    1:57 PM  Fall Risk  Falls in the past year?  0 0 0 0  Was there an injury with Fall?  0 0 0 0  Fall Risk Category Calculator  0 0 0 0  (RETIRED) Patient Fall Risk Level Low fall risk      Patient at Risk for Falls Due to  No Fall Risks No Fall Risks  Impaired balance/gait;Impaired mobility;Mental status change  Fall risk Follow up  Falls evaluation completed Falls evaluation completed Falls evaluation completed;Education provided;Falls prevention discussed Falls evaluation completed  Fall risk Follow up - Comments     High Risk for falls   Functional Status Survey:    Vitals:   05/14/23 1024  BP: 120/64  Pulse: 78  Resp: 16  Temp: 98 F (36.7 C)  SpO2: 93%  Weight: 110 lb (49.9 kg)  Height: 5\' 6"  (1.676 m)   Body mass index is 17.75 kg/m. Physical Exam Vitals reviewed.  Constitutional:      General: He is not in acute distress.    Appearance: Normal appearance. He is normal weight. He is not ill-appearing or diaphoretic.  HENT:     Head: Normocephalic.     Right Ear: Tympanic membrane, ear canal and external ear normal. There is no impacted cerumen.     Left Ear: Tympanic membrane, ear canal and external ear normal. There is no impacted cerumen.     Nose: Nose normal. No congestion or rhinorrhea.     Mouth/Throat:     Mouth: Mucous membranes are moist.     Pharynx: Oropharynx is clear. No oropharyngeal  exudate or posterior oropharyngeal erythema.  Eyes:     General: No scleral icterus.       Right eye: No discharge.        Left eye: No discharge.     Extraocular Movements: Extraocular movements intact.     Conjunctiva/sclera: Conjunctivae normal.     Pupils: Pupils are equal, round, and reactive to light.  Neck:     Vascular: No carotid bruit.  Cardiovascular:     Rate and Rhythm: Normal rate and regular rhythm.     Pulses: Normal pulses.     Heart sounds: Normal heart sounds. No murmur heard.    No friction rub. No gallop.  Pulmonary:     Effort: Pulmonary effort is normal. No  respiratory distress.     Breath sounds: Normal breath sounds. No wheezing, rhonchi or rales.  Chest:     Chest wall: No tenderness.  Abdominal:     General: Bowel sounds are normal. There is no distension.     Palpations: Abdomen is soft. There is no mass.     Tenderness: There is no abdominal tenderness. There is no right CVA tenderness, left CVA tenderness, guarding or rebound.  Musculoskeletal:        General: No swelling or tenderness. Normal range of motion.     Cervical back: Normal range of motion. No rigidity or tenderness.     Right lower leg: No edema.     Left lower leg: No edema.  Lymphadenopathy:     Cervical: No cervical adenopathy.  Skin:    General: Skin is warm and dry.     Coloration: Skin is not pale.     Findings: No bruising, erythema, lesion or rash.  Neurological:     Mental Status: He is alert and oriented to person, place, and time.     Cranial Nerves: No cranial nerve deficit.     Sensory: No sensory deficit.     Motor: No weakness.     Coordination: Coordination normal.     Gait: Gait abnormal.  Psychiatric:        Mood and Affect: Mood normal.        Speech: Speech normal.        Behavior: Behavior is agitated.        Thought Content: Thought content normal.        Cognition and Memory: Memory is impaired.        Judgment: Judgment normal.     Comments: Holding  saliva in the mouth and spitting in the trash can        Labs reviewed: Recent Labs    12/31/22 0747 01/02/23 1450 05/12/23 2013  NA 137 140 142  K 3.9 4.0 3.9  CL 106 106 105  CO2 25 28 25   GLUCOSE 89 95 120*  BUN 10 9 11   CREATININE 0.96 1.03 1.36*  CALCIUM 10.0 10.4* 10.7*   Recent Labs    12/30/22 0122 01/02/23 1450 05/12/23 2013  AST 20 32 21  ALT 10 14 10   ALKPHOS 69  --  72  BILITOT 0.8 0.3 0.4  PROT 6.5 6.3 6.8  ALBUMIN 3.5  --  3.6   Recent Labs    12/04/22 1520 12/30/22 0122 01/02/23 1450 05/12/23 2013  WBC 4.2 6.5 3.6* 10.4  NEUTROABS 1,999  --  1,501 9.1*  HGB 13.9 13.1 12.3* 12.2*  HCT 40.2 37.3* 35.5* 36.5*  MCV 98.3 95.9 96.2 98.9  PLT 237 187 198 241   Lab Results  Component Value Date   TSH 1.75 12/04/2022   Lab Results  Component Value Date   HGBA1C 5.6 08/05/2022   Lab Results  Component Value Date   CHOL 124 12/04/2022   HDL 54 12/04/2022   LDLCALC 57 12/04/2022   TRIG 58 12/04/2022   CHOLHDL 2.3 12/04/2022    Significant Diagnostic Results in last 30 days:  MR BRAIN WO CONTRAST  Result Date: 05/12/2023 CLINICAL DATA:  Acute neurologic deficit EXAM: MRI HEAD WITHOUT CONTRAST TECHNIQUE: Multiplanar, multiecho pulse sequences of the brain and surrounding structures were obtained without intravenous contrast. COMPARISON:  02/01/2022 FINDINGS: Brain: No acute infarct, mass effect or extra-axial collection. Mild siderosis of the right temporal lobe at the site of old  right temporal infarct. Chronic microhemorrhage in the left basal ganglia. There is multifocal hyperintense T2-weighted signal within the white matter. Generalized volume loss. Cavum septum pellucidum et vergae. Vascular: Major flow voids are preserved. Skull and upper cervical spine: Normal calvarium and skull base. Visualized upper cervical spine and soft tissues are normal. Sinuses/Orbits:No paranasal sinus fluid levels or advanced mucosal thickening. No mastoid or middle  ear effusion. Normal orbits. IMPRESSION: 1. No acute intracranial abnormality. 2. Old right temporal infarct and findings of chronic small vessel disease. Electronically Signed   By: Deatra Robinson M.D.   On: 05/12/2023 23:17   CT HEAD WO CONTRAST ( )  Result Date: 05/12/2023 CLINICAL DATA:  Mental status change, unknown cause EXAM: CT HEAD WITHOUT CONTRAST TECHNIQUE: Contiguous axial images were obtained from the base of the skull through the vertex without intravenous contrast. RADIATION DOSE REDUCTION: This exam was performed according to the departmental dose-optimization program which includes automated exposure control, adjustment of the mA and/or kV according to patient size and/or use of iterative reconstruction technique. COMPARISON:  CT Head 02/05/22 FINDINGS: Brain: No evidence of acute infarction, hemorrhage, hydrocephalus, extra-axial collection or mass lesion/mass effect. Chronic right temporal lobe infarct. Cavum septum pellucidum et vergae Vascular: No hyperdense vessel or unexpected calcification. Skull: Normal. Negative for fracture or focal lesion. Sinuses/Orbits: No middle ear or mastoid effusion. Paranasal sinuses are clear. Bilateral lens replacement. Orbits are otherwise unremarkable. Other: None. IMPRESSION: No CT finding to explain altered mental status Electronically Signed   By: Lorenza Cambridge M.D.   On: 05/12/2023 21:11    Assessment/Plan 1. Weight loss, abnormal Son reports sleeping at night and during the day not wanting to eat meals.will reduce Remeron back to 7.5 then d/C if still sleeping.start on Depakote as below  - mirtazapine (REMERON) 7.5 MG tablet; Take 1 tablet (7.5 mg total) by mouth at bedtime. For appetite  Dispense: 30 tablet; Refill: 0 - CBC with Differential/Platelet  2. Dehydration Encouraged to increase fluid intake  - recommend peadyltes  - SLP modified barium swallow; Future - Ambulatory referral to Speech Therapy - CBC with Differential/Platelet -  Basic metabolic panel  3. Alzheimer's dementia without behavioral disturbance [facility]) Son reports resist care and meals. - start on Depakote  - divalproex (DEPAKOTE) 125 MG DR tablet; Take 1 tablet (125 mg total) by mouth 2 (two) times daily.  Dispense: 30 tablet; Refill: 1  4. Oropharyngeal dysphagia Holding saliva in the mouth and spitting in the trash can  Son reports chews food or holds in the mouth then spits .patient tells me this visit unable to swallow.  - SLP modified barium swallow; Future - Ambulatory referral to Speech Therapy  5. Urinary frequency Afebrile  Will rule out UTI - Urine Culture - POC Urinalysis Dipstick indicates brownish red dark urine positive for hemolyzed trace blood,protein,nitrite positive and moderate Leukocytes.  - will send urine for culture   Family/ staff Communication: Reviewed plan of care with patient verbalized understanding   Labs/tests ordered:  - SLP modified barium swallow; Future - Urine Culture - POC Urinalysis Dipstick   Next Appointment: Return if symptoms worsen or fail to improve.   Caesar Bookman, NP

## 2023-05-15 ENCOUNTER — Telehealth (HOSPITAL_COMMUNITY): Payer: Self-pay | Admitting: *Deleted

## 2023-05-15 LAB — URINE CULTURE

## 2023-05-15 NOTE — Telephone Encounter (Signed)
Attempted to contact patient to schedule OP MBS. Left VM. RKEEL 

## 2023-05-18 ENCOUNTER — Other Ambulatory Visit (HOSPITAL_COMMUNITY): Payer: Self-pay | Admitting: *Deleted

## 2023-05-18 DIAGNOSIS — R1310 Dysphagia, unspecified: Secondary | ICD-10-CM

## 2023-05-18 LAB — URINE CULTURE
MICRO NUMBER:: 15218656
SPECIMEN QUALITY:: ADEQUATE

## 2023-05-19 ENCOUNTER — Other Ambulatory Visit: Payer: Self-pay

## 2023-05-19 ENCOUNTER — Ambulatory Visit: Payer: PPO | Attending: Family

## 2023-05-19 DIAGNOSIS — E860 Dehydration: Secondary | ICD-10-CM | POA: Insufficient documentation

## 2023-05-19 DIAGNOSIS — R1311 Dysphagia, oral phase: Secondary | ICD-10-CM | POA: Insufficient documentation

## 2023-05-19 DIAGNOSIS — R1312 Dysphagia, oropharyngeal phase: Secondary | ICD-10-CM | POA: Insufficient documentation

## 2023-05-19 DIAGNOSIS — R1310 Dysphagia, unspecified: Secondary | ICD-10-CM | POA: Diagnosis not present

## 2023-05-19 NOTE — Progress Notes (Deleted)
OUTPATIENT SPEECH LANGUAGE PATHOLOGY EVALUATION   Patient Name: Jason VANCOTT Sr. MRN: [id number] DOB:[birth year], 89 y.o., male Today's Date: 05/19/2023  PCP: Caesar Bookman, NPRef Provider (PCP)  REFERRING PROVIDER: Caesar Bookman, NPRef Provider (PCP)   END OF SESSION:   Past Medical History:  Diagnosis Date   Arthritis    hp bone,knees,hands,shoulders   BPH (benign prostatic hyperplasia)    BPH (benign prostatic hyperplasia)    Cataract    Dementia without behavioral disturbance (HCC) 12/30/2022   Diverticulosis    GERD (gastroesophageal reflux disease)    Glaucoma    History of stroke    Hyperlipemia    Hypertension    Loss of appetite    MCI (mild cognitive impairment)    Memory impairment    Sleep apnea    Past Surgical History:  Procedure Laterality Date   BUNIONECTOMY Left 05/16/2016   CATARACT EXTRACTION Bilateral 02/2013   TRANSURETHRAL RESECTION OF PROSTATE  1997   Patient Active Problem List   Diagnosis Date Noted   AKI (acute kidney injury) (HCC) 12/30/2022   Dehydration 12/30/2022   Alzheimer's dementia without behavioral disturbance (HCC) 12/30/2022   Diverticulosis 03/23/2015   Hypertension    GERD (gastroesophageal reflux disease)     ONSET DATE:   05/14/2023     REFERRING DIAG:  E86.0 (ICD-10-CM) - Dehydration  R13.12 (ICD-10-CM) - Oropharyngeal dysphagia    THERAPY DIAG:  No diagnosis found.  Rationale for Evaluation and Treatment: {HABREHAB:27488}  SUBJECTIVE:   SUBJECTIVE STATEMENT: *** Pt accompanied by: {accompnied:27141}  PERTINENT HISTORY: Pt is a 89 y.o. male seen 05/14/23 for an acute visit for ED follow up generalized weakness / fatigue and issues with swallowing.He is here with son who providers lack of appetite.   PAIN:  Are you having pain? {OPRCPAIN:27236}  FALLS: Has patient fallen in last 6 months?  {LKGMWNUU:72536}  LIVING ENVIRONMENT: Lives with: {OPRC lives with:25569::"lives with their  family"} Lives in: {Lives in:[address]}  PLOF:  Level of assistance: {UYQIHKV:42595} Employment: {SLPemployment:25674}  PATIENT GOALS: ***  OBJECTIVE:   DIAGNOSTIC FINDINGS: ***  COGNITION: Overall cognitive status: {cognition:24006} Areas of impairment:  {cognitiveimpairmentslp:27409} Functional deficits: ***  AUDITORY COMPREHENSION: Overall auditory comprehension: {IMPAIRED:25374} YES/NO questions: {IMPAIRED:25374} Following directions: {IMPAIRED:25374} Conversation: {SLP conversation:25430} Interfering components: {SLP interfering components:25431} Effective technique: {SLP effective technique:25432}  READING COMPREHENSION: {SLPreadingcomprehension:27140}  EXPRESSION: {SLP EXPRESSION:25433}  VERBAL EXPRESSION: Level of generative/spontaneous verbalization: {SLP level of generative/spontaneious verbalization:25435} Automatic speech: {SLP ATOMIC SPEECH:25434}  Repetition: {SLPrepetion:27212} Naming: {SLPnaming:27214} Pragmatics: {slppragmatics:27216} Comments: *** Interfering components: {SLP INTERFERING COMPONENTS:25436} Effective technique: {SLP EFFECTIVE TECHNIQUE:25437} Non-verbal means of communication: {SLP non verbal means of communication:25438}  WRITTEN EXPRESSION: Dominant hand: {RIGHT/LEFT:20294} Written expression: {slpwrittenexp:27209}  MOTOR SPEECH: Overall motor speech: {slpimpaired:27210} Level of impairment: {SLP level of impairment:25441} Respiration: {respbreathing:27195} Phonation: {SLP phonation:25439} Resonance: {SLP resonance:25440} Articulation: {SLParticulation:27218} Intelligibility: {SLP Intelligible:25442} Motor planning: {slpmotorspeecherrors:27220} Motor speech errors: {SLP motor speech errors:25443} Interfering components: {SLP Interfering components (MS):25444} Effective technique: {SLP effective technique (MS):25445}  ORAL MOTOR EXAMINATION: Overall status: {OMESLP2:27645} Comments: ***  RECOMMENDATIONS FROM OBJECTIVE  SWALLOW STUDY (MBSS/FEES):  *** Objective swallow impairments: *** Objective recommended compensations: ***  CLINICAL SWALLOW ASSESSMENT:   Current diet: {slpdiet:27196} Dentition: {dentition:27197} Patient directly observed with POs: {POobserved:27199} Feeding: {slp feeding:27200} Liquids provided by: {SLPliquids:27201} Oral phase signs and symptoms: {SLPoralphase:27202} Pharyngeal phase signs and symptoms: {SLPpharyngealphase:27203} Comments: ***  STANDARDIZED ASSESSMENTS: {SLPstandardizedassessment:27092}  PATIENT REPORTED OUTCOME MEASURES (PROM): {SLPPROM:27095}   TODAY'S TREATMENT:  DATE: ***   PATIENT EDUCATION: Education details: *** Person educated: {Person educated:25204} Education method: {Education Method:25205} Education comprehension: {Education Comprehension:25206}   GOALS: Goals reviewed with patient? {yes/no:20286}  SHORT TERM GOALS: Target date: ***  *** Baseline: Goal status: INITIAL  2.  *** Baseline:  Goal status: INITIAL  3.  *** Baseline:  Goal status: INITIAL  4.  *** Baseline:  Goal status: INITIAL  5.  *** Baseline:  Goal status: INITIAL  6.  *** Baseline:  Goal status: INITIAL  LONG TERM GOALS: Target date: ***  *** Baseline:  Goal status: INITIAL  2.  *** Baseline:  Goal status: INITIAL  3.  *** Baseline:  Goal status: INITIAL  4.  *** Baseline:  Goal status: INITIAL  5.  *** Baseline:  Goal status: INITIAL  6.  *** Baseline:  Goal status: INITIAL  ASSESSMENT:  CLINICAL IMPRESSION: Patient is a *** y.o. *** who was seen today for ***.   OBJECTIVE IMPAIRMENTS: include {SLPOBJIMP:27107}. These impairments are limiting patient from {SLPLIMIT:27108}. Factors affecting potential to achieve goals and functional outcome are {SLP factors:25450}. Patient will benefit from  skilled SLP services to address above impairments and improve overall function.  REHAB POTENTIAL: {rehabpotential:25112}  PLAN:  SLP FREQUENCY: {rehab frequency:25116}  SLP DURATION: {rehab duration:25117}  PLANNED INTERVENTIONS: {SLP treatment/interventions:25449}    [facility], Student-SLP 05/19/2023, 9:16 AM

## 2023-05-19 NOTE — Therapy (Unsigned)
OUTPATIENT SPEECH LANGUAGE PATHOLOGY EVALUATION   Patient Name: Jason Castaneda. MRN: [id number] DOB:November 01, 1930, 89 y.o., male Today's Date: 05/20/2023  PCP: Caesar Bookman, NP  REFERRING PROVIDER: Caesar Bookman, NP  END OF SESSION:  End of Session - 05/19/23 1449     Visit Number 1    Number of Visits 5    Date for SLP Re-Evaluation 06/23/23   extra week for MBSS scheduling   Authorization Type HTA $15 copay    SLP Start Time 1322    SLP Stop Time  1400    SLP Time Calculation (min) 38 min    Activity Tolerance Patient tolerated treatment well             Past Medical History:  Diagnosis Date   Arthritis    hp bone,knees,hands,shoulders   BPH (benign prostatic hyperplasia)    BPH (benign prostatic hyperplasia)    Cataract    Dementia without behavioral disturbance (HCC) 12/30/2022   Diverticulosis    GERD (gastroesophageal reflux disease)    Glaucoma    History of stroke    Hyperlipemia    Hypertension    Loss of appetite    MCI (mild cognitive impairment)    Memory impairment    Sleep apnea    Past Surgical History:  Procedure Laterality Date   BUNIONECTOMY Left 05/16/2016   CATARACT EXTRACTION Bilateral 02/2013   TRANSURETHRAL RESECTION OF PROSTATE  1997   Patient Active Problem List   Diagnosis Date Noted   AKI (acute kidney injury) (HCC) 12/30/2022   Dehydration 12/30/2022   Alzheimer's dementia without behavioral disturbance (HCC) 12/30/2022   Diverticulosis 03/23/2015   Hypertension    GERD (gastroesophageal reflux disease)     ONSET DATE:  05/14/2023   REFERRING DIAG:  E86.0 (ICD-10-CM) - Dehydration  R13.12 (ICD-10-CM) - Oropharyngeal dysphagia    THERAPY DIAG: Dysphagia, oral phase - Plan: SLP plan of care cert/re-cert  Rationale for Evaluation and Treatment: Rehabilitation  SUBJECTIVE:   SUBJECTIVE STATEMENT: "could not get some of his food down. Spitting out food."--son  Pt accompanied by: family member  PERTINENT  HISTORY: "Pt is a 89 y.o. male seen 05/14/23 for an acute visit for ED follow up generalized weakness / fatigue and issues with swallowing.He is here with son who providers lack of appetite. Holding saliva in the mouth and spitting in the trash can during visit"  PMHX significant for dementia, diverticulosis, GERD, hx of stroke, MCI   PAIN: Are you having pain? No  FALLS: Has patient fallen in last 6 months?  No  LIVING ENVIRONMENT: Lives with: lives with their son Lives in: House/apartment  PLOF:  Level of assistance: Needed assistance with ADLs, Needed assistance with IADLS Employment: Retired  PATIENT GOALS: "to increase nutrition"   OBJECTIVE:   DIAGNOSTIC FINDINGS: awaiting MBSS 05-28-23  COGNITION: Overall cognitive status: History of cognitive impairments - at baseline  ORAL MOTOR EXAMINATION: Overall status: WFL Comments: slightly limited anterior tongue movement.   RECOMMENDATIONS FROM OBJECTIVE SWALLOW STUDY (MBSS/FEES):  Scheduled 05/28/23 Objective swallow impairments: Update after MBSS completed 05/28/23  Objective recommended compensations:  Update after MBSS completed 05/28/23   CLINICAL SWALLOW ASSESSMENT:   Current diet: regular and thin liquids Dentition: adequate natural dentition Patient directly observed with POs: Yes: regular and thin liquids  Feeding: able to feed self Liquids provided by: cup Oral phase signs and symptoms: prolonged mastication, prolonged bolus formation, and oral holding Pharyngeal phase signs and symptoms:  Delayed weak cough x2 Comments:  CSE revealed mildly prolonged mastication, delayed AP transit, minimal/mild oral residue with need to alternate liquids/solids to clear, and multiple swallows. Enlarged cup sips noted with multiple swallows noted. Delayed cough x2.    PATIENT REPORTED OUTCOME MEASURES (PROM): EAT-10  My swallowing problem has caused me to lose weight  4  My swallowing problem interferes with my ability to go out for  meals. 4  Swallowing liquids takes extra effort 2  Swallowing solids takes extra effort.  3  Swallowing pills takes extra effort. 3  Swallowing is painful. 0  The pleasure of eating is affected by my swallowing. 3  When I swallow food sticks in my throat 3  I cough when I eat 3  Swallowing is stressful 3    TODAY'S TREATMENT:                                                                                                                                          05/19/23: Patient states that he does not have any trouble swallowing, but son states that he will not finish his food and will often spit his food out. Patient has lost weight over the last few months and the problem has gotten worse. States that they have supplemented with Pedialyte and Ensure to hit nutrition and hydration needs. Education provided on how to modify meals to increase appetite (see pt instructions). Per MD request, MBSS scheduled for 05/28/23, education provided on what information the MBSS would add. Patient has not modified diet, and is not avoiding any food, but son states that the lack of calories/intake is concerning. Education provided on swallowing compensations including, smaller sips, taking pills with puree to aid AP transit, and supplementing with smaller snacks throughout the day.    PATIENT EDUCATION: Education details: See above Person educated: Patient and Child(ren) Education method: Explanation Education comprehension: verbalized understanding   GOALS: Goals reviewed with patient? Yes  LONG TERM GOALS: Target date: 06/23/2023 (STGs=LTGs)    Patient will complete MBSS to evaluate pharyngeal function and assess for risk of aspiration (add goals PRN) Baseline:  Goal status: INITIAL  2.  Pt and caregiver will carryover recommended swallow precautions to optimize safety and intake > 1 week  Baseline:  Goal status: INITIAL  3.  Pt will report improvement via EAT-10 by dc Baseline: 28 Goal status:  INITIAL   ASSESSMENT:  CLINICAL IMPRESSION: Patient is a 89 y.o. M who was seen today for dysphagia in light of dementia/Alzheimer's Disease. Caregiver reporting patient with inconsistent PO intake, increased frequency of spitting out food and saliva, and occasional difficulty swallowing (ex: tilting head back to take pills). CSE revealed mildly prolonged mastication, adequate oral clearance with independent alternation of textures, delayed AP transit with lingual pumping, and multiple swallows. Delayed, weak cough exhibited x2 with no other s/sx of aspiration noted. Concern for significant weight loss and reduced appetite expressed. SLP suspects  cognitive dysphagia d/t Alzheimer's Disease. MBSS ordered next week to rule out aspiration. Pt would benefit from skilled ST to address aforementioned deficits to facilitate improved swallow function/safety .    OBJECTIVE IMPAIRMENTS: include dysphagia. These impairments are limiting patient from safety when swallowing. Factors affecting potential to achieve goals and functional outcome are ability to learn/carryover information, co-morbidities, and cooperation/participation level. Patient will benefit from skilled SLP services to address above impairments and improve overall function.  REHAB POTENTIAL: Good  PLAN:  SLP FREQUENCY: 1x/week  SLP DURATION: other: 5 weeks for scheduling of MBSS  PLANNED INTERVENTIONS: Aspiration precaution training, Diet toleration management , Trials of upgraded texture/liquids, Cueing hierachy, Internal/external aids, Functional tasks, SLP instruction and feedback, Compensatory strategies, and Patient/family education    Gracy Racer, CCC-SLP 05/20/2023, 8:39 AM

## 2023-05-20 ENCOUNTER — Other Ambulatory Visit: Payer: Self-pay | Admitting: Family

## 2023-05-20 DIAGNOSIS — N3001 Acute cystitis with hematuria: Secondary | ICD-10-CM

## 2023-05-20 MED ORDER — CIPROFLOXACIN HCL 500 MG PO TABS
500.0000 mg | ORAL_TABLET | Freq: Two times a day (BID) | ORAL | 0 refills | Status: AC
Start: 2023-05-20 — End: 2023-05-27

## 2023-05-20 MED ORDER — SACCHAROMYCES BOULARDII 250 MG PO CAPS
250.0000 mg | ORAL_CAPSULE | Freq: Two times a day (BID) | ORAL | 0 refills | Status: AC
Start: 2023-05-20 — End: 2023-05-30

## 2023-05-20 NOTE — Progress Notes (Signed)
Cipro send to pharmacy

## 2023-05-21 ENCOUNTER — Other Ambulatory Visit: Payer: Self-pay

## 2023-05-21 DIAGNOSIS — R350 Frequency of micturition: Secondary | ICD-10-CM

## 2023-05-22 ENCOUNTER — Telehealth: Payer: Self-pay

## 2023-05-26 ENCOUNTER — Ambulatory Visit: Payer: PPO | Admitting: Family

## 2023-05-26 ENCOUNTER — Other Ambulatory Visit: Payer: PPO

## 2023-05-27 ENCOUNTER — Ambulatory Visit: Payer: PPO | Admitting: Family

## 2023-05-27 VITALS — BP 122/88 | HR 71 | Temp 97.2°F | Resp 16 | Ht 66.0 in | Wt 110.6 lb

## 2023-05-27 DIAGNOSIS — E785 Hyperlipidemia, unspecified: Secondary | ICD-10-CM

## 2023-05-27 DIAGNOSIS — M791 Myalgia, unspecified site: Secondary | ICD-10-CM

## 2023-05-27 MED ORDER — ATORVASTATIN CALCIUM 20 MG PO TABS
20.0000 mg | ORAL_TABLET | Freq: Every day | ORAL | 0 refills | Status: DC
Start: 2023-05-27 — End: 2023-05-31

## 2023-05-27 NOTE — Patient Instructions (Signed)
Take tylenol 500 mg tablet one by mouth twice daily for pain

## 2023-05-27 NOTE — Progress Notes (Signed)
Provider: Richarda Blade FNP-C  Jason Castaneda, Jason Citrin, NP  Patient Care Team: Jason Castaneda, Jason Citrin, NP as PCP - General (Family Medicine)  Extended Emergency Contact Information Primary Emergency Contact: Castaneda,Jason Address: [address]          [place], [place] [address] [place] Home Phone: [phone] Mobile Phone: [phone] Relation: Son Secondary Emergency Contact: Castaneda,Jason Mobile Phone: [phone] Relation: Grandson Preferred language: English Interpreter needed? No  Code Status:  Full Code  Goals of care: Advanced Directive information    05/27/2023    2:48 PM  Advanced Directives  Does Patient Have a Medical Advance Directive? Yes  Type of Advance Directive Healthcare Power of Attorney  Does patient want to make changes to medical advance directive? No - Patient declined  Copy of Healthcare Power of Attorney in Chart? Yes - validated most recent copy scanned in chart (See row information)     Chief Complaint  Patient presents with   Acute Visit    Patient complains of muscle pain and decreased mobility.     HPI:  Pt is a 89 y.o. male seen today for an acute visit for evaluation of left leg muscle pain.he is here with his son.  Pain described as constant.HPI limited due to patient's cognitive impairment secondary to dementia.  Has had decreased mobility per son. He denies any numbness, tingling or weakness in the leg. Also denies any injury to leg. Has tried physical therapy in the past. Has upcoming swallow study and follow up with speech therapist.still holding and spitting out medication and food.   Weight stable this visit.Son has been encouraging oral intake but continues to spit food out.  He denies any symptoms of depression or anxiety.  Past Medical History:  Diagnosis Date   Arthritis    hp bone,knees,hands,shoulders   BPH (benign prostatic hyperplasia)    BPH (benign prostatic hyperplasia)    Cataract    Dementia without  behavioral disturbance (HCC) 12/30/2022   Diverticulosis    GERD (gastroesophageal reflux disease)    Glaucoma    History of stroke    Hyperlipemia    Hypertension    Loss of appetite    MCI (mild cognitive impairment)    Memory impairment    Sleep apnea    Past Surgical History:  Procedure Laterality Date   BUNIONECTOMY Left 05/16/2016   CATARACT EXTRACTION Bilateral 02/2013   TRANSURETHRAL RESECTION OF PROSTATE  1997    Allergies  Allergen Reactions   Donepezil     Outpatient Encounter Medications as of 05/27/2023  Medication Sig   aspirin EC 81 MG tablet Take 81 mg by mouth daily. Swallow whole.   atorvastatin (LIPITOR) 40 MG tablet Take 1 tablet by mouth once daily   Cholecalciferol (D3 ADULT PO) Take 1 tablet by mouth daily.   ciprofloxacin (CIPRO) 500 MG tablet Take 1 tablet (500 mg total) by mouth 2 (two) times daily for 7 days.   divalproex (DEPAKOTE) 125 MG DR tablet Take 1 tablet (125 mg total) by mouth 2 (two) times daily.   loratadine (CLARITIN) 10 MG tablet Take 10 mg by mouth daily as needed for allergies. Take one tablet daily   losartan (COZAAR) 50 MG tablet Take 1 tablet (50 mg total) by mouth daily.   memantine (NAMENDA) 5 MG tablet Take 1 tablet (5 mg total) by mouth 2 (two) times daily.   mirtazapine (REMERON) 7.5 MG tablet Take 1 tablet (7.5 mg total) by mouth at bedtime. For  appetite   Multiple Vitamins-Minerals (ONE DAILY COMPLETE FOR MEN) TABS Take 1 tablet by mouth daily.   Nutritional Supplements (ENSURE ACTIVE HIGH PROTEIN) LIQD Take 1 Can by mouth daily.   saccharomyces boulardii (FLORASTOR) 250 MG capsule Take 1 capsule (250 mg total) by mouth 2 (two) times daily for 10 days.   No facility-administered encounter medications on file as of 05/27/2023.    Review of Systems  Constitutional:  Negative for appetite change, chills, fatigue, fever and unexpected weight change.  HENT:  Negative for congestion, dental problem, ear discharge, ear pain,  facial swelling, hearing loss, nosebleeds, postnasal drip, rhinorrhea, sinus pressure, sinus pain, sneezing, sore throat, tinnitus and trouble swallowing.   Respiratory:  Negative for cough, chest tightness, shortness of breath and wheezing.   Cardiovascular:  Negative for chest pain, palpitations and leg swelling.  Gastrointestinal:  Negative for abdominal distention, abdominal pain, blood in stool, constipation, diarrhea, nausea and vomiting.  Genitourinary:  Negative for difficulty urinating, dysuria, flank pain, frequency and urgency.  Musculoskeletal:  Positive for arthralgias and gait problem. Negative for back pain, joint swelling, myalgias, neck pain and neck stiffness.       Left leg calf muscle pain   Skin:  Negative for color change, pallor, rash and wound.  Neurological:  Negative for dizziness, syncope, speech difficulty, weakness, light-headedness, numbness and headaches.  Psychiatric/Behavioral:  Negative for agitation, behavioral problems, confusion, hallucinations and sleep disturbance. The patient is not nervous/anxious.        Memory loss     Immunization History  Administered Date(s) Administered   Covid-19, Mrna,Vaccine(Spikevax)28yrs and older 08/03/2022   Influenza, High Dose Seasonal PF 07/15/2022   PFIZER Comirnaty(Gray Top)Covid-19 Tri-Sucrose Vaccine 07/21/2020   PFIZER(Purple Top)SARS-COV-2 Vaccination 11/21/2019, 12/12/2019   Pfizer Covid-19 Vaccine Bivalent Booster 19yrs & up 09/23/2021   Pneumococcal Conjugate-13 12/09/2017, 05/27/2018   Pneumococcal Polysaccharide-23 04/18/2021   Rsv, Bivalent, Protein Subunit Rsvpref,pf Jason Castaneda) 07/15/2022   Tdap 04/17/2018   Zoster Recombinant(Shingrix) 08/20/2018, 04/18/2021   Pertinent  Health Maintenance Due  Topic Date Due   INFLUENZA VACCINE  05/28/2023      12/10/2022    9:30 AM 01/02/2023    2:08 PM 01/21/2023    2:27 PM 05/13/2023    1:57 PM 05/27/2023    2:48 PM  Fall Risk  Falls in the past year? 0 0 0 0 1   Was there an injury with Fall? 0 0 0 0 0  Fall Risk Category Calculator 0 0 0 0 1  Patient at Risk for Falls Due to No Fall Risks No Fall Risks  Impaired balance/gait;Impaired mobility;Mental status change History of fall(s);Impaired balance/gait;Impaired mobility  Fall risk Follow up Falls evaluation completed Falls evaluation completed Falls evaluation completed;Education provided;Falls prevention discussed Falls evaluation completed Falls evaluation completed;Education provided;Falls prevention discussed  Fall risk Follow up - Comments    High Risk for falls    Functional Status Survey:    Vitals:   05/27/23 1444  BP: 122/88  Pulse: 71  Resp: 16  Temp: (!) 97.2 F (36.2 C)  SpO2: 98%  Weight: 110 lb 9.6 oz (50.2 kg)  Height: 5\' 6"  (1.676 m)   Body mass index is 17.85 kg/m. Physical Exam Vitals reviewed.  Constitutional:      General: He is not in acute distress.    Appearance: Normal appearance. He is underweight. He is not ill-appearing or diaphoretic.  HENT:     Head: Normocephalic.     Nose: Nose normal. No congestion or  rhinorrhea.     Mouth/Throat:     Mouth: Mucous membranes are moist.     Pharynx: Oropharynx is clear. No oropharyngeal exudate or posterior oropharyngeal erythema.  Eyes:     General: No scleral icterus.       Right eye: No discharge.        Left eye: No discharge.     Extraocular Movements: Extraocular movements intact.     Conjunctiva/sclera: Conjunctivae normal.     Pupils: Pupils are equal, round, and reactive to light.  Neck:     Vascular: No carotid bruit.  Cardiovascular:     Rate and Rhythm: Normal rate and regular rhythm.     Pulses: Normal pulses.     Heart sounds: Normal heart sounds. No murmur heard.    No friction rub. No gallop.  Pulmonary:     Effort: Pulmonary effort is normal. No respiratory distress.     Breath sounds: Normal breath sounds. No wheezing, rhonchi or rales.  Chest:     Chest wall: No tenderness.  Abdominal:      General: Bowel sounds are normal. There is no distension.     Palpations: Abdomen is soft. There is no mass.     Tenderness: There is no abdominal tenderness. There is no right CVA tenderness, left CVA tenderness, guarding or rebound.  Musculoskeletal:        General: No swelling or tenderness. Normal range of motion.     Cervical back: Normal range of motion. No rigidity or tenderness.     Right lower leg: No edema.     Left lower leg: No edema.  Lymphadenopathy:     Cervical: No cervical adenopathy.  Skin:    General: Skin is warm and dry.     Coloration: Skin is not pale.     Findings: No erythema or rash.  Neurological:     Mental Status: He is alert. Mental status is at baseline.     Cranial Nerves: No cranial nerve deficit.     Sensory: No sensory deficit.     Motor: No weakness.     Coordination: Coordination normal.     Gait: Gait abnormal.  Psychiatric:        Mood and Affect: Mood normal.        Speech: Speech normal.        Behavior: Behavior normal.     Labs reviewed: Recent Labs    01/02/23 1450 05/12/23 2013 05/26/23 1016  NA 140 142 142  K 4.0 3.9 4.1  CL 106 105 103  CO2 28 25 30   GLUCOSE 95 120* 93  BUN 9 11 8   CREATININE 1.03 1.36* 0.91  CALCIUM 10.4* 10.7* 10.9*   Recent Labs    12/30/22 0122 01/02/23 1450 05/12/23 2013  AST 20 32 21  ALT 10 14 10   ALKPHOS 69  --  72  BILITOT 0.8 0.3 0.4  PROT 6.5 6.3 6.8  ALBUMIN 3.5  --  3.6   Recent Labs    01/02/23 1450 05/12/23 2013 05/26/23 1016  WBC 3.6* 10.4 3.1*  NEUTROABS 1,501 9.1* 1,432*  HGB 12.3* 12.2* 12.7*  HCT 35.5* 36.5* 38.0*  MCV 96.2 98.9 97.7  PLT 198 241 235   Lab Results  Component Value Date   TSH 1.75 12/04/2022   Lab Results  Component Value Date   HGBA1C 5.6 08/05/2022   Lab Results  Component Value Date   CHOL 124 12/04/2022   HDL 54 12/04/2022  LDLCALC 57 12/04/2022   TRIG 58 12/04/2022   CHOLHDL 2.3 12/04/2022    Significant Diagnostic Results  in last 30 days:  MR BRAIN WO CONTRAST  Result Date: 05/12/2023 CLINICAL DATA:  Acute neurologic deficit EXAM: MRI HEAD WITHOUT CONTRAST TECHNIQUE: Multiplanar, multiecho pulse sequences of the brain and surrounding structures were obtained without intravenous contrast. COMPARISON:  02/01/2022 FINDINGS: Brain: No acute infarct, mass effect or extra-axial collection. Mild siderosis of the right temporal lobe at the site of old right temporal infarct. Chronic microhemorrhage in the left basal ganglia. There is multifocal hyperintense T2-weighted signal within the white matter. Generalized volume loss. Cavum septum pellucidum et vergae. Vascular: Major flow voids are preserved. Skull and upper cervical spine: Normal calvarium and skull base. Visualized upper cervical spine and soft tissues are normal. Sinuses/Orbits:No paranasal sinus fluid levels or advanced mucosal thickening. No mastoid or middle ear effusion. Normal orbits. IMPRESSION: 1. No acute intracranial abnormality. 2. Old right temporal infarct and findings of chronic small vessel disease. Electronically Signed   By: Deatra Robinson M.D.   On: 05/12/2023 23:17   CT HEAD WO CONTRAST ( )  Result Date: 05/12/2023 CLINICAL DATA:  Mental status change, unknown cause EXAM: CT HEAD WITHOUT CONTRAST TECHNIQUE: Contiguous axial images were obtained from the base of the skull through the vertex without intravenous contrast. RADIATION DOSE REDUCTION: This exam was performed according to the departmental dose-optimization program which includes automated exposure control, adjustment of the mA and/or kV according to patient size and/or use of iterative reconstruction technique. COMPARISON:  CT Head 02/05/22 FINDINGS: Brain: No evidence of acute infarction, hemorrhage, hydrocephalus, extra-axial collection or mass lesion/mass effect. Chronic right temporal lobe infarct. Cavum septum pellucidum et vergae Vascular: No hyperdense vessel or unexpected calcification.  Skull: Normal. Negative for fracture or focal lesion. Sinuses/Orbits: No middle ear or mastoid effusion. Paranasal sinuses are clear. Bilateral lens replacement. Orbits are otherwise unremarkable. Other: None. IMPRESSION: No CT finding to explain altered mental status Electronically Signed   By: Lorenza Cambridge M.D.   On: 05/12/2023 21:11    Assessment/Plan  1. Muscle pain Left calf muscle pain.Non-tender to palpation on exam and without any erythema or swelling. - atorvastatin (LIPITOR) 20 MG tablet; Take 1 tablet (20 mg total) by mouth daily.  Dispense: 90 tablet; Refill: 0  2. Hyperlipidemia LDL goal <100 LDL at goal Will decrease atorvastatin from 20 mg to 10 mg tablet given also weight and complaints of left calf muscle pain. - atorvastatin (LIPITOR) 10 MG tablet; Take 1 tablet (10 mg total) by mouth daily.  Dispense: 90 tablet; Refill: 0  Family/ staff Communication: Reviewed plan of care with patient verbalized understanding  Labs/tests ordered: None   Next Appointment: Return if symptoms worsen or fail to improve.   Caesar Bookman, NP

## 2023-05-28 ENCOUNTER — Ambulatory Visit (HOSPITAL_COMMUNITY)
Admission: RE | Admit: 2023-05-28 | Discharge: 2023-05-28 | Disposition: A | Discharge status: Home / Self Care | Payer: PPO | Source: Ambulatory Visit | Attending: Family | Admitting: Family

## 2023-05-28 DIAGNOSIS — R1312 Dysphagia, oropharyngeal phase: Secondary | ICD-10-CM | POA: Diagnosis not present

## 2023-05-28 DIAGNOSIS — R1310 Dysphagia, unspecified: Secondary | ICD-10-CM

## 2023-05-28 DIAGNOSIS — Z8673 Personal history of transient ischemic attack (TIA), and cerebral infarction without residual deficits: Secondary | ICD-10-CM | POA: Diagnosis not present

## 2023-05-28 DIAGNOSIS — R634 Abnormal weight loss: Secondary | ICD-10-CM | POA: Diagnosis not present

## 2023-05-28 DIAGNOSIS — F0280 Dementia in other diseases classified elsewhere without behavioral disturbance: Secondary | ICD-10-CM | POA: Insufficient documentation

## 2023-05-28 DIAGNOSIS — G309 Alzheimer's disease, unspecified: Secondary | ICD-10-CM | POA: Diagnosis not present

## 2023-05-28 DIAGNOSIS — E860 Dehydration: Secondary | ICD-10-CM

## 2023-05-28 NOTE — Therapy (Signed)
Modified Barium Swallow Study  Patient Details  Name: Jason NAKAMURA Sr. MRN: [id number] Date of Birth: [birth year]  Today's Date: 05/28/2023  Modified Barium Swallow completed.  Full report located under Chart Review in the Imaging Section.  History of Present Illness Jason Gantt Sr. is an 89 y.o. male with PMH: Alzheimer's dementia, AKI, dehydration, HTN, GERD, sleep apnea, h/o CVA. He was seen for evaluation at OP SLP secondary to family reporting he has been spitting food out often, holds saliva in mouth, has lost weight, has been getting dehydrated. OP MBS ordered to objectively assess swallow function.   Clinical Impression Patient presents with a primary cognitive based dysphagia with secondary structural impairment from suspected cervical osteophytes. No penetration or aspiration observed with any of the tested bolus consistencies. Swallow initiation delay to level of pyriform sinus observed with thin liquids but with all other liquid and solids initiating at level of vallecular sinus. Only trace vallecular and pyriform residuals observed with solid texture PO's, but with full clearance with subsequent swallows. Cervical osteophytes suspected and appeared to cause mildly reduced flow of barium through PES and upper esophagus but no barium retention or retrograde movement. Patient did not exhibit any oral holding behavior with thin liquids, and only exhibited a mild delay in anterior to posterior transit of nectar thick and honey thick liquids. He exhibited oral holding, prolonged oral transit with puree solids and mechanical soft solids, requiring cues to redirect attention. Giving him a sip of liquids did help to initiate swallow to clear oral cavity. SLP discussed results with son and recommended talking to PCP about possible benefit from consulting with dietician. Further f/u regarding swallow can be completed with OP SLP. Factors that may increase risk of adverse event in presence of  aspiration Rubye Oaks & Clearance [name] 2021): Reduced cognitive function;Frail or deconditioned  Swallow Evaluation Recommendations Recommendations: PO diet PO Diet Recommendation: Dysphagia 3 (Mechanical soft);Thin liquids (Level 0) Liquid Administration via: Cup;Straw Medication Administration: Whole meds with puree Supervision: Full supervision/cueing for swallowing strategies;Patient able to self-feed Swallowing strategies  : Minimize environmental distractions;Check for pocketing or oral holding Postural changes: Position pt fully upright for meals     Angela Nevin, MA, CCC-SLP Speech Therapy

## 2023-05-31 MED ORDER — ATORVASTATIN CALCIUM 10 MG PO TABS
10.0000 mg | ORAL_TABLET | Freq: Every day | ORAL | 3 refills | Status: DC
Start: 2023-05-31 — End: 2024-02-02

## 2023-06-02 ENCOUNTER — Telehealth: Payer: Self-pay

## 2023-06-02 NOTE — Telephone Encounter (Signed)
Spoke to patient's son [name] and he states that patient has been urinating on himself a lot more lately. He states that patient was fond in the middle of the night standing in the middle of the floor undressed due to him wetting himself. Are there any suggestions. He has scheduled a mychart visit for 06/08/23  Message sent to Richarda Blade, NP

## 2023-06-02 NOTE — Telephone Encounter (Signed)
Recommend incontinent adult size depends and referral to urologist.

## 2023-06-03 ENCOUNTER — Ambulatory Visit: Payer: PPO | Admitting: Family

## 2023-06-03 ENCOUNTER — Ambulatory Visit: Payer: PPO

## 2023-06-03 NOTE — Telephone Encounter (Signed)
Noted  

## 2023-06-03 NOTE — Telephone Encounter (Signed)
Spoke with patient's son and discussed response from Richarda Blade, NP. He verbalized his understanding. He states that he will follow up with [facility] as this is where the patient goes.  Message sent to Richarda Blade, NP [facility])

## 2023-06-08 ENCOUNTER — Telehealth: Payer: PPO | Admitting: Family

## 2023-06-11 ENCOUNTER — Ambulatory Visit: Payer: PPO | Admitting: Family

## 2023-06-12 ENCOUNTER — Telehealth (INDEPENDENT_AMBULATORY_CARE_PROVIDER_SITE_OTHER): Payer: PPO | Admitting: Family

## 2023-06-12 ENCOUNTER — Encounter: Payer: Self-pay | Admitting: Family

## 2023-06-12 DIAGNOSIS — G309 Alzheimer's disease, unspecified: Secondary | ICD-10-CM

## 2023-06-12 DIAGNOSIS — F0280 Dementia in other diseases classified elsewhere without behavioral disturbance: Secondary | ICD-10-CM

## 2023-06-12 DIAGNOSIS — R634 Abnormal weight loss: Secondary | ICD-10-CM | POA: Diagnosis not present

## 2023-06-12 DIAGNOSIS — R5381 Other malaise: Secondary | ICD-10-CM

## 2023-06-12 NOTE — Progress Notes (Signed)
This service is provided via telemedicine  No vital signs collected/recorded due to the encounter was a telemedicine visit.   Location of patient (ex: home, work):  Home   Patient consents to a telephone visit:  Yes, 06/11/2401  Location of the provider (ex: office, home):  [facility]  Name of any referring provider: Ngetich, [name], NP   Names of all persons participating in the telemedicine service and their role in the encounter: Jason Castaneda B/CMA, Ngetich, Dinah C, NP, and patient  Time spent on call:  11 minutes

## 2023-06-12 NOTE — Progress Notes (Signed)
I connected with  Jason MILAND Sr. on 06/12/23 by a video enabled telemedicine application and verified that I am speaking with the correct person using two identifiers.   I discussed the limitations of evaluation and management by telemedicine. The patient expressed understanding and agreed to proceed.     Provider: Richarda Blade FNP-C  Alisson Rozell, [name], NP  Patient Care Team: Tyrika Newman, [name], NP as PCP - General (Family Medicine)  Extended Emergency Contact Information Primary Emergency Contact: Rabideau,[name] Address: [address]          [place], [place] [address] [place] Home Phone: [phone] Mobile Phone: [phone] Relation: Son Secondary Emergency Contact: Mizer,[name] Mobile Phone: [phone] Relation: Grandson Preferred language: English Interpreter needed? No  Code Status:  Full Code  Goals of care: Advanced Directive information    06/12/2023   10:28 AM  Advanced Directives  Does Patient Have a Medical Advance Directive? Yes  Type of Advance Directive Healthcare Power of Attorney  Does patient want to make changes to medical advance directive? No - Patient declined  Copy of Healthcare Power of Attorney in Chart? Yes - validated most recent copy scanned in chart (See row information)     Chief Complaint  Patient presents with   Acute Visit    Discuss plan of care (memory and urinary issues)    HPI:  Pt is a 89 y.o. male seen today for an acute visit for evaluation of fall.He is seen on video with assist from son who provides additional HPI information.states patient Larey Seat on Tuesday when trying to close the back door.Patient walked without his walker. Fell on his face bruising below the eye and eyebrow.son has been cleaning bruise with hydrogen peroxide and applying Neosporin. No signs of infection.no change in vision.Has [facility] provider who evaluated patient at home.  Son concerned patient continues to spit out food. Had a  swallow study dysphagia 3 mechanical soft,thin liquids level  with whole meds with puree.Full supervision /cuing for swallowing strategies. Son request hospice service due to decline in condition.  Has upcoming follow up appointment with Urologist next week.   Past Medical History:  Diagnosis Date   Arthritis    hp bone,knees,hands,shoulders   BPH (benign prostatic hyperplasia)    BPH (benign prostatic hyperplasia)    Cataract    Dementia without behavioral disturbance (HCC) 12/30/2022   Diverticulosis    GERD (gastroesophageal reflux disease)    Glaucoma    History of stroke    Hyperlipemia    Hypertension    Loss of appetite    MCI (mild cognitive impairment)    Memory impairment    Sleep apnea    Past Surgical History:  Procedure Laterality Date   BUNIONECTOMY Left 05/16/2016   CATARACT EXTRACTION Bilateral 02/2013   TRANSURETHRAL RESECTION OF PROSTATE  1997    Allergies  Allergen Reactions   Donepezil     Outpatient Encounter Medications as of 06/12/2023  Medication Sig   aspirin EC 81 MG tablet Take 81 mg by mouth daily. Swallow whole.   atorvastatin (LIPITOR) 10 MG tablet Take 1 tablet (10 mg total) by mouth daily.   Cholecalciferol (D3 ADULT PO) Take 1 tablet by mouth daily.   divalproex (DEPAKOTE) 125 MG DR tablet Take 1 tablet (125 mg total) by mouth 2 (two) times daily.   loratadine (CLARITIN) 10 MG tablet Take 10 mg by mouth daily as needed for allergies. Take one tablet daily   losartan (COZAAR) 50  MG tablet Take 1 tablet (50 mg total) by mouth daily.   memantine (NAMENDA) 5 MG tablet Take 1 tablet (5 mg total) by mouth 2 (two) times daily.   mirtazapine (REMERON) 7.5 MG tablet Take 1 tablet (7.5 mg total) by mouth at bedtime. For appetite   Multiple Vitamins-Minerals (ONE DAILY COMPLETE FOR MEN) TABS Take 1 tablet by mouth daily.   Nutritional Supplements (ENSURE ACTIVE HIGH PROTEIN) LIQD Take 1 Can by mouth daily.   No facility-administered encounter  medications on file as of 06/12/2023.    Review of Systems  Constitutional:  Positive for appetite change and unexpected weight change. Negative for chills, fatigue and fever.       Generalized weakness   HENT:  Negative for congestion, dental problem, ear discharge, ear pain, facial swelling, hearing loss, nosebleeds, postnasal drip, rhinorrhea, sinus pressure, sinus pain, sneezing, sore throat and tinnitus.        Hold food and spits out   Eyes:  Negative for pain, discharge, redness, itching and visual disturbance.  Respiratory:  Negative for cough, chest tightness, shortness of breath and wheezing.   Cardiovascular:  Negative for chest pain, palpitations and leg swelling.  Gastrointestinal:  Negative for abdominal distention, abdominal pain, blood in stool, constipation, diarrhea, nausea and vomiting.  Genitourinary:  Negative for difficulty urinating, dysuria, flank pain, hematuria and urgency.  Musculoskeletal:  Positive for gait problem. Negative for arthralgias, back pain, joint swelling, myalgias, neck pain and neck stiffness.       Recent fall episode hitting face on the ground   Skin:  Negative for color change, pallor, rash and wound.  Neurological:  Negative for dizziness, syncope, speech difficulty, weakness, light-headedness, numbness and headaches.  Hematological:  Does not bruise/bleed easily.  Psychiatric/Behavioral:  Negative for agitation, behavioral problems, confusion, hallucinations and sleep disturbance. The patient is not nervous/anxious.     Immunization History  Administered Date(s) Administered   Covid-19, Mrna,Vaccine(Spikevax)14yrs and older 08/03/2022   Influenza, High Dose Seasonal PF 07/15/2022   PFIZER Comirnaty(Gray Top)Covid-19 Tri-Sucrose Vaccine 07/21/2020   PFIZER(Purple Top)SARS-COV-2 Vaccination 11/21/2019, 12/12/2019   Pfizer Covid-19 Vaccine Bivalent Booster 13yrs & up 09/23/2021   Pneumococcal Conjugate-13 12/09/2017, 05/27/2018   Pneumococcal  Polysaccharide-23 04/18/2021   Rsv, Bivalent, Protein Subunit Rsvpref,pf Verdis Frederickson) 07/15/2022   Tdap 04/17/2018   Zoster Recombinant(Shingrix) 08/20/2018, 04/18/2021   Pertinent  Health Maintenance Due  Topic Date Due   INFLUENZA VACCINE  05/28/2023      01/02/2023    2:08 PM 01/21/2023    2:27 PM 05/13/2023    1:57 PM 05/27/2023    2:48 PM 06/12/2023   10:22 AM  Fall Risk  Falls in the past year? 0 0 0 1 1  Was there an injury with Fall? 0 0 0 0 1  Fall Risk Category Calculator 0 0 0 1 3  Patient at Risk for Falls Due to No Fall Risks  Impaired balance/gait;Impaired mobility;Mental status change History of fall(s);Impaired balance/gait;Impaired mobility History of fall(s);Impaired balance/gait;Impaired mobility  Fall risk Follow up Falls evaluation completed Falls evaluation completed;Education provided;Falls prevention discussed Falls evaluation completed Falls evaluation completed;Education provided;Falls prevention discussed Falls evaluation completed  Fall risk Follow up - Comments   High Risk for falls     Functional Status Survey:    There were no vitals filed for this visit. There is no height or weight on file to calculate BMI. Physical Exam Vitals reviewed.  Constitutional:      General: He is not in acute distress.  Appearance: Normal appearance. He is normal weight. He is not ill-appearing or diaphoretic.  HENT:     Head: Normocephalic.  Pulmonary:     Effort: Pulmonary effort is normal. No respiratory distress.  Neurological:     Mental Status: He is alert. Mental status is at baseline.     Gait: Gait abnormal.  Psychiatric:        Mood and Affect: Mood normal.        Speech: Speech normal.        Behavior: Behavior normal.     Labs reviewed: Recent Labs    01/02/23 1450 05/12/23 2013 05/26/23 1016  NA 140 142 142  K 4.0 3.9 4.1  CL 106 105 103  CO2 28 25 30   GLUCOSE 95 120* 93  BUN 9 11 8   CREATININE 1.03 1.36* 0.91  CALCIUM 10.4* 10.7* 10.9*    Recent Labs    12/30/22 0122 01/02/23 1450 05/12/23 2013  AST 20 32 21  ALT 10 14 10   ALKPHOS 69  --  72  BILITOT 0.8 0.3 0.4  PROT 6.5 6.3 6.8  ALBUMIN 3.5  --  3.6   Recent Labs    01/02/23 1450 05/12/23 2013 05/26/23 1016  WBC 3.6* 10.4 3.1*  NEUTROABS 1,501 9.1* 1,432*  HGB 12.3* 12.2* 12.7*  HCT 35.5* 36.5* 38.0*  MCV 96.2 98.9 97.7  PLT 198 241 235   Lab Results  Component Value Date   TSH 1.75 12/04/2022   Lab Results  Component Value Date   HGBA1C 5.6 08/05/2022   Lab Results  Component Value Date   CHOL 124 12/04/2022   HDL 54 12/04/2022   LDLCALC 57 12/04/2022   TRIG 58 12/04/2022   CHOLHDL 2.3 12/04/2022    Significant Diagnostic Results in last 30 days:  DG SWALLOW FUNC OP MEDICARE SPEECH PATH  Result Date: 05/28/2023 Table formatting from the original result was not included. Modified Barium Swallow Study Patient Details Name: Jason HEINZEL Sr. MRN: [id number] Date of Birth: 06-02-1931 Today's Date: 05/28/2023 HPI/PMH: HPI: Jason Spragg Sr. is an 89 y.o. male with PMH: Alzheimer's dementia, AKI, dehydration, HTN, GERD, sleep apnea, h/o CVA. He was seen for evaluation at OP SLP secondary to family reporting he has been spitting food out often, holds saliva in mouth, has lost weight, has been getting dehydrated. OP MBS ordered to objectively assess swallow function. Clinical Impression: Clinical Impression: Patient presents with a primary cognitive based dysphagia with secondary structural impairment from suspected cervical osteophytes. No penetration or aspiration observed with any of the tested bolus consistencies. Swallow initiation delay to level of pyriform sinus observed with thin liquids but with all other liquid and solids initiating at level of vallecular sinus. Only trace vallecular and pyriform residuals observed with solid texture PO's, but with full clearance with subsequent swallows. Cervical osteophytes suspected and appeared to cause mildly  reduced flow of barium through PES and upper esophagus but no barium retention or retrograde movement. Patient did not exhibit any oral holding behavior with thin liquids, and only exhibited a mild delay in anterior to posterior transit of nectar thick and honey thick liquids. He exhibited oral holding, prolonged oral transit with puree solids and mechanical soft solids, requiring cues to redirect attention. Giving him a sip of liquids did help to initiate swallow to clear oral cavity. SLP discussed results with son and recommended talking to PCP about possible benefit from consulting with dietician. Further f/u regarding swallow can be completed with OP SLP.  Factors that may increase risk of adverse event in presence of aspiration Rubye Oaks & Clearance [name] 2021): Factors that may increase risk of adverse event in presence of aspiration Rubye Oaks & Clearance [name] 2021): Reduced cognitive function; Frail or deconditioned Recommendations/Plan: Swallowing Evaluation Recommendations Swallowing Evaluation Recommendations Recommendations: PO diet PO Diet Recommendation: Dysphagia 3 (Mechanical soft); Thin liquids (Level 0) Liquid Administration via: Cup; Straw Medication Administration: Whole meds with puree Supervision: Full supervision/cueing for swallowing strategies; Patient able to self-feed Swallowing strategies  : Minimize environmental distractions; Check for pocketing or oral holding Postural changes: Position pt fully upright for meals Treatment Plan Treatment Plan Treatment recommendations: Defer treatment plan to SLP at other venue (see follow-up recommendations) Follow-up recommendations: Outpatient SLP Recommendations Recommendations for follow up therapy are one component of a multi-disciplinary discharge planning process, led by the attending physician.  Recommendations may be updated based on patient status, additional functional criteria and insurance authorization. Assessment: Orofacial Exam: Orofacial Exam Oral Cavity:  Oral Hygiene: WFL Oral Cavity - Dentition: Adequate natural dentition Orofacial Anatomy: WFL Oral Motor/Sensory Function: WFL Anatomy: Anatomy: Suspected cervical osteophytes Boluses Administered: Boluses Administered Boluses Administered: Thin liquids (Level 0); Mildly thick liquids (Level 2, nectar thick); Moderately thick liquids (Level 3, honey thick); Solid; Puree  Oral Impairment Domain: Oral Impairment Domain Lip Closure: No labial escape Tongue control during bolus hold: Not tested Bolus preparation/mastication: Slow prolonged chewing/mashing with complete recollection Bolus transport/lingual motion: Repetitive/disorganized tongue motion Oral residue: Complete oral clearance Location of oral residue : N/A Initiation of pharyngeal swallow : Valleculae; Posterior laryngeal surface of the epiglottis; Pyriform sinuses  Pharyngeal Impairment Domain: Pharyngeal Impairment Domain Soft palate elevation: No bolus between soft palate (SP)/pharyngeal wall (PW) Laryngeal elevation: Partial superior movement of thyroid cartilage/partial approximation of arytenoids to epiglottic petiole Anterior hyoid excursion: Partial anterior movement Epiglottic movement: Partial inversion Laryngeal vestibule closure: Complete, no air/contrast in laryngeal vestibule Pharyngeal stripping wave : Present - complete Pharyngeal contraction (A/P view only): N/A Pharyngoesophageal segment opening: Complete distension and complete duration, no obstruction of flow Tongue base retraction: No contrast between tongue base and posterior pharyngeal wall (PPW) Pharyngeal residue: Trace residue within or on pharyngeal structures Location of pharyngeal residue: Valleculae; Pyriform sinuses  Esophageal Impairment Domain: Esophageal Impairment Domain Esophageal clearance upright position: Complete clearance, esophageal coating Pill: Pill Consistency administered: Puree Puree: WFL Penetration/Aspiration Scale Score: Penetration/Aspiration Scale Score 1.   Material does not enter airway: Thin liquids (Level 0); Mildly thick liquids (Level 2, nectar thick); Moderately thick liquids (Level 3, honey thick); Puree; Solid; Pill Compensatory Strategies: Compensatory Strategies Compensatory strategies: Yes Liquid wash: Effective   General Information: Caregiver present: Yes  Diet Prior to this Study: Regular; Dysphagia 3 (mechanical soft); Thin liquids (Level 0)   No data recorded  Respiratory Status: WFL   Supplemental O2: None (Room air)   History of Recent Intubation: No  Behavior/Cognition: Alert; Cooperative; Pleasant mood; Requires cueing Self-Feeding Abilities: Able to self-feed Baseline vocal quality/speech: Hypophonia/low volume; Normal Volitional Cough: Unable to elicit Volitional Swallow: Unable to elicit Exam Limitations: No limitations Goal Planning: No data recorded No data recorded No data recorded No data recorded Consulted and agree with results and recommendations: Patient; Family member/caregiver Pain: Pain Assessment Pain Assessment: No/denies pain End of Session: Start Time:SLP Start Time (ACUTE ONLY): 1155 Stop Time: SLP Stop Time (ACUTE ONLY): 1215 Time Calculation:SLP Time Calculation (min) (ACUTE ONLY): 20 min Charges: SLP Evaluations $ SLP Speech Visit: 1 Visit SLP Evaluations $Outpatient MBS Swallow: 1 Procedure SLP visit diagnosis: SLP Visit  Diagnosis: Dysphagia, oropharyngeal phase (R13.12) Past Medical History: Past Medical History: Diagnosis Date  Arthritis   hp bone,knees,hands,shoulders  BPH (benign prostatic hyperplasia)   BPH (benign prostatic hyperplasia)   Cataract   Dementia without behavioral disturbance (HCC) 12/30/2022  Diverticulosis   GERD (gastroesophageal reflux disease)   Glaucoma   History of stroke   Hyperlipemia   Hypertension   Loss of appetite   MCI (mild cognitive impairment)   Memory impairment   Sleep apnea  Past Surgical History: Past Surgical History: Procedure Laterality Date  BUNIONECTOMY Left 05/16/2016  CATARACT  EXTRACTION Bilateral 02/2013  TRANSURETHRAL RESECTION OF PROSTATE  1997 Angela Nevin, MA, CCC-SLP Speech Therapy CLINICAL DATA:  Dysphagia.  History of dementia. EXAM: MODIFIED BARIUM SWALLOW TECHNIQUE: Different consistencies of barium were administered orally to the patient by the Speech Pathologist. Imaging of the pharynx was performed in the lateral projection. Loyce Dys PA-C was present in the fluoroscopy room during this study, which was supervised and interpreted by Malachi Pro, MD. FLUOROSCOPY: Radiation Exposure Index (as provided by the fluoroscopic device): 12.88 mGy Kerma COMPARISON:  None Available. FINDINGS: Vestibular  Penetration:  None seen. Aspiration:  None seen. Other: Prolonged retention within the oral cavity requiring multiple cues to initiate swallow, particularly with more thickened textures. IMPRESSION: 1. No aspiration. Please refer to the Speech Pathologists report for complete details and recommendations. Electronically Signed   By: Obie Dredge M.D.   On: 05/28/2023 12:30   Assessment/Plan 1. Weight loss, abnormal Has had progressive decline due to poor oral intake holds and spits food out of the mouth. - Ambulatory referral to Hospice  2. Alzheimer's dementia without behavioral disturbance (HCC) Requires assistance with his ADL's. has had progressive decline. Has DNR in place son will bring copy then will update records.  - Ambulatory referral to Hospice  3. Physical deconditioning Suspect due to poor oral intake and cognitive impairment.Hold food in mouth and spitting out. Continue to encourage small but frequent meals.No acute abnormalities on swallow study.  - Ambulatory referral to Hospice  Family/ staff Communication: Reviewed plan of care with patient and son verbalized understanding.   Labs/tests ordered: None   Next Appointment: Return if symptoms worsen or fail to improve.  Spent 12 minutes of face to face with patient  >50% time spent  counseling; reviewing medical record; tests; labs; and developing future plan of care.  Caesar Bookman, NP

## 2023-06-16 ENCOUNTER — Ambulatory Visit: Payer: PPO | Attending: Family

## 2023-06-16 NOTE — Therapy (Deleted)
OUTPATIENT SPEECH LANGUAGE PATHOLOGY TREATMENT   Patient Name: Jason LOOMIS Sr. MRN: [id number] DOB:[birth year], 89 y.o., male Today's Date: 06/16/2023  PCP: Caesar Bookman, NP  REFERRING PROVIDER: Ngetich, [name], NP  END OF SESSION:    Past Medical History:  Diagnosis Date   Arthritis    hp bone,knees,hands,shoulders   BPH (benign prostatic hyperplasia)    BPH (benign prostatic hyperplasia)    Cataract    Dementia without behavioral disturbance (HCC) 12/30/2022   Diverticulosis    GERD (gastroesophageal reflux disease)    Glaucoma    History of stroke    Hyperlipemia    Hypertension    Loss of appetite    MCI (mild cognitive impairment)    Memory impairment    Sleep apnea    Past Surgical History:  Procedure Laterality Date   BUNIONECTOMY Left 05/16/2016   CATARACT EXTRACTION Bilateral 02/2013   TRANSURETHRAL RESECTION OF PROSTATE  1997   Patient Active Problem List   Diagnosis Date Noted   AKI (acute kidney injury) (HCC) 12/30/2022   Dehydration 12/30/2022   Alzheimer's dementia without behavioral disturbance (HCC) 12/30/2022   Diverticulosis 03/23/2015   Hypertension    GERD (gastroesophageal reflux disease)     ONSET DATE:  05/14/2023   REFERRING DIAG:  E86.0 (ICD-10-CM) - Dehydration  R13.12 (ICD-10-CM) - Oropharyngeal dysphagia    THERAPY DIAG: No diagnosis found.  Rationale for Evaluation and Treatment: Rehabilitation  SUBJECTIVE:   SUBJECTIVE STATEMENT: "could not get some of his food down. Spitting out food."--son  Pt accompanied by: family member  PERTINENT HISTORY: "Pt is a 89 y.o. male seen 05/14/23 for an acute visit for ED follow up generalized weakness / fatigue and issues with swallowing.He is here with son who providers lack of appetite. Holding saliva in the mouth and spitting in the trash can during visit"  PMHX significant for dementia, diverticulosis, GERD, hx of stroke, MCI   PAIN: Are you having pain? No  FALLS: Has  patient fallen in last 6 months?  No  LIVING ENVIRONMENT: Lives with: lives with their son Lives in: House/apartment  PLOF:  Level of assistance: Needed assistance with ADLs, Needed assistance with IADLS Employment: Retired  PATIENT GOALS: "to increase nutrition"   OBJECTIVE:   DIAGNOSTIC FINDINGS: awaiting MBSS 05-28-23  COGNITION: Overall cognitive status: History of cognitive impairments - at baseline  RECOMMENDATIONS FROM OBJECTIVE SWALLOW STUDY (MBSS 05-28-23):  Patient presents with a primary cognitive based dysphagia with secondary structural impairment from suspected cervical osteophytes. No penetration or aspiration observed with any of the tested bolus consistencies. Swallow initiation delay to level of pyriform sinus observed with thin liquids but with all other liquid and solids initiating at level of vallecular sinus. Only trace vallecular and pyriform residuals observed with solid texture PO's, but with full clearance with subsequent swallows. Cervical osteophytes suspected and appeared to cause mildly reduced flow of barium through PES and upper esophagus but no barium retention or retrograde movement. Patient did not exhibit any oral holding behavior with thin liquids, and only exhibited a mild delay in anterior to posterior transit of nectar thick and honey thick liquids. He exhibited oral holding, prolonged oral transit with puree solids and mechanical soft solids, requiring cues to redirect attention. Giving him a sip of liquids did help to initiate swallow to clear oral cavity. SLP discussed results with son and recommended talking to PCP about possible benefit from consulting with dietician. Further f/u regarding swallow can be completed with OP SLP.  TODAY'S TREATMENT:                                                                                                                                          06/16/23:  05/19/23: Patient states that he does not have any trouble  swallowing, but son states that he will not finish his food and will often spit his food out. Patient has lost weight over the last few months and the problem has gotten worse. States that they have supplemented with Pedialyte and Ensure to hit nutrition and hydration needs. Education provided on how to modify meals to increase appetite (see pt instructions). Per MD request, MBSS scheduled for 05/28/23, education provided on what information the MBSS would add. Patient has not modified diet, and is not avoiding any food, but son states that the lack of calories/intake is concerning. Education provided on swallowing compensations including, smaller sips, taking pills with puree to aid AP transit, and supplementing with smaller snacks throughout the day.    PATIENT EDUCATION: Education details: See above Person educated: Patient and Child(ren) Education method: Explanation Education comprehension: verbalized understanding   GOALS: Goals reviewed with patient? Yes  LONG TERM GOALS: Target date: 06/23/2023 (STGs=LTGs)    Patient will complete MBSS to evaluate pharyngeal function and assess for risk of aspiration (add goals PRN) Baseline:  Goal status: MET  2.  Pt and caregiver will carryover recommended swallow precautions to optimize safety and intake > 1 week  Baseline:  Goal status: INITIAL  3.  Pt will report improvement via EAT-10 by dc Baseline: 28 Goal status: INITIAL   ASSESSMENT:  CLINICAL IMPRESSION: Patient is a 89 y.o. M who was seen today for dysphagia in light of dementia/Alzheimer's Disease. Caregiver reporting patient with inconsistent PO intake, increased frequency of spitting out food and saliva, and occasional difficulty swallowing (ex: tilting head back to take pills). CSE revealed mildly prolonged mastication, adequate oral clearance with independent alternation of textures, delayed AP transit with lingual pumping, and multiple swallows. Delayed, weak cough exhibited x2  with no other s/sx of aspiration noted. Concern for significant weight loss and reduced appetite expressed. SLP suspects cognitive dysphagia d/t Alzheimer's Disease. MBSS ordered next week to rule out aspiration. Pt would benefit from skilled ST to address aforementioned deficits to facilitate improved swallow function/safety .    OBJECTIVE IMPAIRMENTS: include dysphagia. These impairments are limiting patient from safety when swallowing. Factors affecting potential to achieve goals and functional outcome are ability to learn/carryover information, co-morbidities, and cooperation/participation level. Patient will benefit from skilled SLP services to address above impairments and improve overall function.  REHAB POTENTIAL: Good  PLAN:  SLP FREQUENCY: 1x/week  SLP DURATION: other: 5 weeks for scheduling of MBSS  PLANNED INTERVENTIONS: Aspiration precaution training, Diet toleration management , Trials of upgraded texture/liquids, Cueing hierachy, Internal/external aids, Functional tasks, SLP instruction and feedback, Compensatory strategies, and Patient/family education  Gracy Racer, CCC-SLP 06/16/2023, 10:07 AM

## 2023-07-09 ENCOUNTER — Encounter (HOSPITAL_COMMUNITY): Payer: Self-pay

## 2023-07-09 ENCOUNTER — Emergency Department (HOSPITAL_COMMUNITY)
Admission: EM | Admit: 2023-07-09 | Discharge: 2023-07-09 | Disposition: A | Discharge status: Home / Self Care | Attending: Emergency Medicine | Admitting: Emergency Medicine

## 2023-07-09 DIAGNOSIS — I10 Essential (primary) hypertension: Secondary | ICD-10-CM | POA: Insufficient documentation

## 2023-07-09 DIAGNOSIS — Z7982 Long term (current) use of aspirin: Secondary | ICD-10-CM | POA: Diagnosis not present

## 2023-07-09 DIAGNOSIS — E860 Dehydration: Secondary | ICD-10-CM | POA: Insufficient documentation

## 2023-07-09 DIAGNOSIS — R55 Syncope and collapse: Secondary | ICD-10-CM | POA: Diagnosis present

## 2023-07-09 DIAGNOSIS — Z79899 Other long term (current) drug therapy: Secondary | ICD-10-CM | POA: Diagnosis not present

## 2023-07-09 LAB — CBC WITH DIFFERENTIAL/PLATELET
Abs Immature Granulocytes: 0.01 10*3/uL (ref 0.00–0.07)
Basophils Absolute: 0 10*3/uL (ref 0.0–0.1)
Basophils Relative: 1 %
Eosinophils Absolute: 0 10*3/uL (ref 0.0–0.5)
Eosinophils Relative: 0 %
HCT: 38.6 % — ABNORMAL LOW (ref 39.0–52.0)
Hemoglobin: 13.3 g/dL (ref 13.0–17.0)
Immature Granulocytes: 0 %
Lymphocytes Relative: 20 %
Lymphs Abs: 0.9 10*3/uL (ref 0.7–4.0)
MCH: 32.7 pg (ref 26.0–34.0)
MCHC: 34.5 g/dL (ref 30.0–36.0)
MCV: 94.8 fL (ref 80.0–100.0)
Monocytes Absolute: 0.3 10*3/uL (ref 0.1–1.0)
Monocytes Relative: 7 %
Neutro Abs: 3.3 10*3/uL (ref 1.7–7.7)
Neutrophils Relative %: 72 %
Platelets: 234 10*3/uL (ref 150–400)
RBC: 4.07 MIL/uL — ABNORMAL LOW (ref 4.22–5.81)
RDW: 13.5 % (ref 11.5–15.5)
WBC: 4.6 10*3/uL (ref 4.0–10.5)
nRBC: 0 % (ref 0.0–0.2)

## 2023-07-09 LAB — COMPREHENSIVE METABOLIC PANEL
ALT: 9 U/L (ref 0–44)
AST: 16 U/L (ref 15–41)
Albumin: 3.9 g/dL (ref 3.5–5.0)
Alkaline Phosphatase: 52 U/L (ref 38–126)
Anion gap: 16 — ABNORMAL HIGH (ref 5–15)
BUN: 14 mg/dL (ref 8–23)
CO2: 24 mmol/L (ref 22–32)
Calcium: 11.5 mg/dL — ABNORMAL HIGH (ref 8.9–10.3)
Chloride: 100 mmol/L (ref 98–111)
Creatinine, Ser: 1.27 mg/dL — ABNORMAL HIGH (ref 0.61–1.24)
GFR, Estimated: 54 mL/min — ABNORMAL LOW (ref >60)
Glucose, Bld: 95 mg/dL (ref 70–99)
Potassium: 3.6 mmol/L (ref 3.5–5.1)
Sodium: 140 mmol/L (ref 135–145)
Total Bilirubin: 1.1 mg/dL (ref 0.3–1.2)
Total Protein: 6.9 g/dL (ref 6.5–8.1)

## 2023-07-09 LAB — TSH: TSH: 1.485 u[IU]/mL (ref 0.350–4.500)

## 2023-07-09 MED ORDER — SODIUM CHLORIDE 0.9 % IV BOLUS
1000.0000 mL | Freq: Once | INTRAVENOUS | Status: AC
  Administered 2023-07-09: 1000 mL via INTRAVENOUS

## 2023-07-09 NOTE — ED Provider Notes (Signed)
[place] EMERGENCY DEPARTMENT AT [facility] Provider Note   CSN: [id number] Arrival date & time: 07/09/23  1813     History  No chief complaint on file.   Felton Clinton Sr. is a 89 y.o. male.  The history is provided by the patient and medical records. No language interpreter was used.  Loss of Consciousness Episode history:  Single Most recent episode:  Today Timing:  Unable to specify Progression:  Unable to specify Chronicity:  New Context: bowel movement and dehydration   Witnessed: yes   Relieved by:  Nothing Worsened by:  Nothing Ineffective treatments:  None tried Associated symptoms: no chest pain, no confusion, no diaphoresis, no difficulty breathing, no fever, no focal weakness, no headaches, no malaise/fatigue, no nausea, no palpitations, no seizures, no shortness of breath, no vomiting and no weakness   Risk factors: no seizures        Home Medications Prior to Admission medications   Medication Sig Start Date End Date Taking? Authorizing Provider  aspirin EC 81 MG tablet Take 81 mg by mouth daily. Swallow whole.    [provider]  atorvastatin (LIPITOR) 10 MG tablet Take 1 tablet (10 mg total) by mouth daily. 05/31/23   Ngetich, Dinah C, NP  Cholecalciferol (D3 ADULT PO) Take 1 tablet by mouth daily.    [provider]  divalproex (DEPAKOTE) 125 MG DR tablet Take 1 tablet (125 mg total) by mouth 2 (two) times daily. 05/14/23   Ngetich, Dinah C, NP  loratadine (CLARITIN) 10 MG tablet Take 10 mg by mouth daily as needed for allergies. Take one tablet daily    [provider]  losartan (COZAAR) 50 MG tablet Take 1 tablet (50 mg total) by mouth daily. 02/12/23   Ngetich, Dinah C, NP  memantine (NAMENDA) 5 MG tablet Take 1 tablet (5 mg total) by mouth 2 (two) times daily. 03/02/23   Lomax, Amy, NP  mirtazapine (REMERON) 7.5 MG tablet Take 1 tablet (7.5 mg total) by mouth at bedtime. For appetite 05/14/23   Ngetich, Dinah C, NP   Multiple Vitamins-Minerals (ONE DAILY COMPLETE FOR MEN) TABS Take 1 tablet by mouth daily.    [provider]  Nutritional Supplements (ENSURE ACTIVE HIGH PROTEIN) LIQD Take 1 Can by mouth daily. 05/11/23   Ngetich, Dinah C, NP      Allergies    Donepezil    Review of Systems   Review of Systems  Constitutional:  Negative for chills, diaphoresis, fever and malaise/fatigue.  HENT:  Negative for congestion.   Eyes:  Negative for visual disturbance.  Respiratory:  Negative for cough, chest tightness and shortness of breath.   Cardiovascular:  Positive for syncope. Negative for chest pain and palpitations.  Gastrointestinal:  Negative for abdominal pain, constipation, diarrhea, nausea and vomiting.  Genitourinary:  Positive for difficulty urinating and urgency. Negative for dysuria, flank pain and frequency.  Musculoskeletal:  Negative for back pain, neck pain and neck stiffness.  Skin:  Negative for rash and wound.  Neurological:  Positive for syncope. Negative for focal weakness, seizures, speech difficulty, weakness, light-headedness and headaches.  Psychiatric/Behavioral:  Negative for agitation and confusion.   All other systems reviewed and are negative.   Physical Exam Updated Vital Signs There were no vitals taken for this visit. Physical Exam Vitals and nursing note reviewed.  Constitutional:      General: He is not in acute distress.    Appearance: He is well-developed. He is not ill-appearing, toxic-appearing  or diaphoretic.  HENT:     Head: Normocephalic and atraumatic.     Nose: No congestion or rhinorrhea.     Mouth/Throat:     Mouth: Mucous membranes are dry.     Pharynx: No oropharyngeal exudate or posterior oropharyngeal erythema.  Eyes:     Conjunctiva/sclera: Conjunctivae normal.     Pupils: Pupils are equal, round, and reactive to light.  Cardiovascular:     Rate and Rhythm: Normal rate and regular rhythm.     Heart sounds: No murmur  heard. Pulmonary:     Effort: Pulmonary effort is normal. No respiratory distress.     Breath sounds: Normal breath sounds. No wheezing, rhonchi or rales.  Chest:     Chest wall: No tenderness.  Abdominal:     General: Abdomen is flat.     Palpations: Abdomen is soft.     Tenderness: There is no abdominal tenderness. There is no right CVA tenderness, left CVA tenderness, guarding or rebound.  Musculoskeletal:        General: No swelling or tenderness.     Cervical back: Neck supple. No tenderness.  Skin:    General: Skin is warm and dry.     Capillary Refill: Capillary refill takes less than 2 seconds.     Findings: No erythema or rash.  Neurological:     General: No focal deficit present.     Mental Status: He is alert.     Sensory: No sensory deficit.     Motor: No weakness.  Psychiatric:        Mood and Affect: Mood normal.     ED Results / Procedures / Treatments   Labs (all labs ordered are listed, but only abnormal results are displayed) Labs Reviewed  CBC WITH DIFFERENTIAL/PLATELET - Abnormal; Notable for the following components:      Result Value   RBC 4.07 (*)    HCT 38.6 (*)    All other components within normal limits  COMPREHENSIVE METABOLIC PANEL - Abnormal; Notable for the following components:   Creatinine, Ser 1.27 (*)    Calcium 11.5 (*)    GFR, Estimated 54 (*)    Anion gap 16 (*)    All other components within normal limits  URINE CULTURE  TSH  URINALYSIS, W/ REFLEX TO CULTURE (INFECTION SUSPECTED)    EKG EKG Interpretation Date/Time:  Thursday July 09 2023 18:45:47 EDT Ventricular Rate:  66 PR Interval:  177 QRS Duration:  93 QT Interval:  381 QTC Calculation: 400 R Axis:   77  Text Interpretation: Sinus rhythm when compared to prior, overall similar appearance. No STEMI Confirmed by Theda Belfast ([id number]) on 07/09/2023 7:25:00 PM  Radiology No results found.  Procedures Procedures    Medications Ordered in ED Medications   sodium chloride 0.9 % bolus 1,000 mL (1,000 mLs Intravenous New Bag/Given 07/09/23 1923)    ED Course/ Medical Decision Making/ A&P                                 Medical Decision Making Amount and/or Complexity of Data Reviewed Labs: ordered.    Felton Clinton Sr. is a 89 y.o. male with a past medical history significant for hypertension, hyperlipidemia, GERD, BPH, previous stroke, memory impairment, and dementia who presents for syncope.  EMS, patient was feeling that he had a bowel movement was sitting on the toilet straining when he had a syncopal episode.  He reportedly did not fall to the floor but was slumped over on the toilet.  Family into the ground and thought he might have been in cardiac arrest.  Upon EMS arrival, patient had blood pressure that was in the 80s systolic.  During transport blood pressures improved and now blood pressure was 112/68 in transport.  Patient denies any symptoms now and specifically denies any chest pain, palpitations, shortness of breath, nausea, vomiting, or urinary symptoms.  He currently denying headache, back pain, or neck pain.  He said some right hip pain but then said that was due to how he was laying on EMS stretcher and that is not bothering him now.  He reports feeling well now.  Of note, he arrives with a MOST form that reports she would not want any fluids.  Family is on their way.  On exam, lungs were clear.  Chest is nontender.  Abdomen nontender.  Patient moving all extremities with intact sensation and strength.  Symmetric smile.  Speech is clear.  Pupils are symmetric and reactive with normal extraocular movements.  Patient does appear slightly dehydrated with dry mucous membranes but otherwise patient well-appearing.  Will get EKG and get vital signs.  Due to his MOST form not wanting fluids, will hold on initial fluid resuscitation which is what I would suspect he may need as chart review shows that he has had several visits  discussing fatigue and suspected dehydration with decreased oral intake.  Will discuss with family what they would like [place] to workup today as he reports he has no symptoms now and is feeling at his baseline.   6:49 PM Family arrived and was able to have a shared decision-making conversation.  Patient now says he does want some fluids, given his dry mucous membranes and will give some fluids.  We will get some screening labs and family said he was having some urgency with urination today so we will get a urinalysis.  Given his lack of any focal abdominal pain, numbness or trauma, chest pain, or shortness of breath, we agreed to hold on imaging of the head, chest, neck, abdomen, or pelvis.  If he is feeling better and vital signs remained reassuring, dissipate discharge home after rehydration for likely vasovagal syncope in the setting of straining for bowel movement.  Workup returned reassuring.  TSH normal, CBC and CMP showed slight increase in creatinine likely related to some dehydration but otherwise did not show critical finding.  Calcium is still elevated.  Patient was unable to urinate but is denying urinary symptoms now and does not want to wait for urinalysis.  He was given a liter of fluids and reports he is feeling well.  We will p.o. challenge as send would rather take him home tonight to follow-up with PCP.  Patient was able to eat and drink and does not want to wait for urinalysis.  Patient will be discharged home for outpatient follow-up.  Family agrees and patient will be discharged.         Final Clinical Impression(s) / ED Diagnoses Final diagnoses:  Syncope, unspecified syncope type  Dehydration    Rx / DC Orders ED Discharge Orders     None       Clinical Impression: 1. Syncope, unspecified syncope type   2. Dehydration     Disposition: Discharge  Condition: Good  I have discussed the results, Dx and Tx plan with the pt(& family if present). He/she/they  expressed understanding and agree(s) with the  plan. Discharge instructions discussed at great length. Strict return precautions discussed and pt &/or family have verbalized understanding of the instructions. No further questions at time of discharge.    New Prescriptions   No medications on file    Follow Up: [facility] [address] [address] [place] [place] [address] [phone]     [facility] Emergency Department at [facility] [address] [place] [address] [phone]        Taje Tondreau, Canary Brim, MD 07/09/23 2207

## 2023-07-09 NOTE — ED Triage Notes (Addendum)
Pt is coming in from having a bowel movement in which he was straining hard and ended up synopsizing from straining. Medic reports a low bp on scene with a 80 sys pressure. Pt tstates he feels better but does have some discomfort at the current time, he has returned back to baseline. He never lost pulses and never had a cardiac event per medic and family. The discomfort he is feeling is in his right hip. Pt has a MOST form which dictates the no IV Fluid.    Medic Vitals 112/68 64hr 122bgl 16rr 96%ra

## 2023-07-09 NOTE — Discharge Instructions (Signed)
Your history, exam, workup today are consistent with dehydration and vasovagal syncope in the setting of trying to have a bowel movement causing you to pass out.  Your labs today showed some dehydration but were otherwise reassuring.  As you had no urinary symptoms and did not want to wait for urinalysis, we feel that was reasonable to discharge home.  Please follow-up with your primary doctor and rest and stay hydrated.  If any symptoms change or worsen acutely, please return to the nearest emergency department.

## 2023-08-17 IMAGING — CT CT ANGIO NECK
1 of 4 series · 2 of 16 positions shown · non-contrast
Comparison: Brain MRI 02/01/2022

CLINICAL DATA: Stroke follow-up

EXAM:
CT ANGIOGRAPHY HEAD AND NECK
TECHNIQUE: Multidetector CT imaging of the head and neck was performed using
the standard protocol during bolus administration of intravenous
contrast. Multiplanar CT image reconstructions and MIPs were
obtained to evaluate the vascular anatomy. Carotid stenosis
measurements (when applicable) are obtained utilizing NASCET
criteria, using the distal internal carotid diameter as the
denominator.

[Series 10: head/neck angio · axial · 0.50mm/px · z∈[-259,-139]mm · 2 of 181 slices shown]
[im 61/181  soft-tissue]
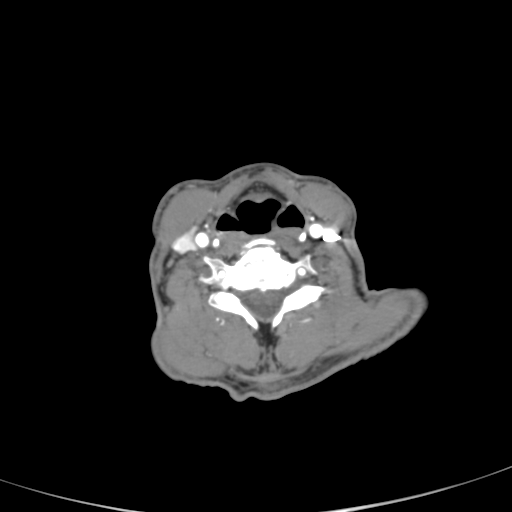
[im 121/181  bone]
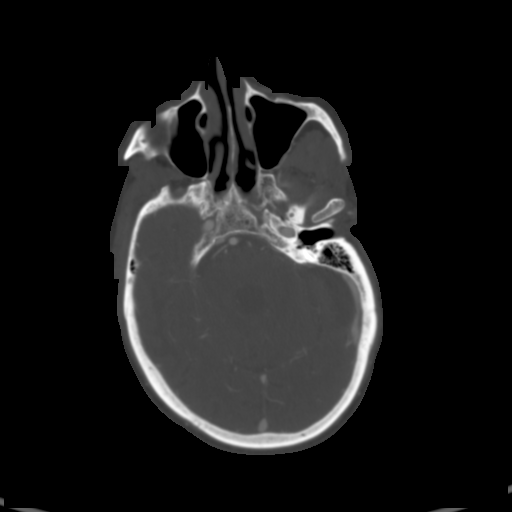

[2 of 16 positions shown; findings below may reference images not displayed]

RADIATION DOSE REDUCTION: This exam was performed according to the
departmental dose-optimization program which includes automated
exposure control, adjustment of the mA and/or kV according to
patient size and/or use of iterative reconstruction technique.

CONTRAST:  75mL 1KCWWE-DYC IOPAMIDOL (1KCWWE-DYC) INJECTION 76%
FINDINGS: CT HEAD FINDINGS

Brain: There is no evidence of acute intracranial hemorrhage,
extra-axial fluid collection, or acute infarct

There is a background of mild global parenchymal volume loss with
prominence of the ventricular system and extra-axial CSF spaces.
There is extensive encephalomalacia throughout most of the right
temporal lobe extending to the posteroinferior parietal lobe
consistent with remote MCA distribution infarct. There is associated
ex vacuo dilatation of the right lateral ventricle. Incidental note
is made of a cavum septum pellucidum et vergae. Additional foci of
hypodensity in the subcortical and periventricular white matter
likely reflect background chronic white matter microangiopathy.

There is no solid mass lesion. There is no mass effect or midline
shift.

Vascular: See below.

Skull: Normal. Negative for fracture or focal lesion.

Sinuses: Paranasal sinuses are clear.

Orbits: Bilateral lens implants are in place. The globes and orbits
are otherwise unremarkable.

Review of the MIP images confirms the above findings

CTA NECK FINDINGS

Aortic arch: There is mild calcified atherosclerotic plaque of the
imaged aortic arch. The origins of the major branch vessels are
patent. The subclavian arteries are patent to the level imaged.

Right carotid system: Right common carotid artery is patent. There
is soft plaque in the proximal right internal carotid artery
resulting in up to approximately 20-30% stenosis. The distal right
internal carotid artery is widely patent. The right external carotid
artery is patent. There is no evidence of dissection or aneurysm.

Left carotid system: The left common, internal, and external carotid
arteries are patent, with no hemodynamically significant stenosis or
occlusion. There is no evidence of dissection or aneurysm.

Vertebral arteries: The vertebral arteries are patent, without
hemodynamically significant stenosis or occlusion. There is no
dissection or aneurysm.

Skeleton: There is congenital partial fusion of the C3 and C4
vertebral bodies. There is multilevel degenerative change of the
cervical spine, most advanced at C5-C6 and C6-C7. There is no acute
osseous abnormality or suspicious osseous lesion. There is no
visible canal hematoma.

Other neck: The soft tissues are unremarkable.

Upper chest: Imaged lung apices are clear.

Review of the MIP images confirms the above findings

CTA HEAD FINDINGS

Anterior circulation: There is mild calcified atherosclerotic plaque
of the bilateral intracranial ICAs with no greater than mild
stenosis bilaterally.

The left M1 segment and distal branches are patent without
significant atherosclerotic irregularity.

The right M1 segment is patent. An inferior M2 branch is critically
stenotic/occluded shortly after its origin within the sylvian
fissure with distal reconstitution, corresponding to the infarct
territory seen on the noncontrast head CT ([DATE], [DATE], [DATE]).
There is additional short-segment critical stenosis/occlusion of a
branch vessel distal to a subsequent bifurcation in the Ferienhaus
distal MCA branches overall appear perfused with mild irregularity.

The bilateral ACAs are patent. The anterior communicating artery is
normal.

There is no aneurysm or AVM.

Posterior circulation: The bilateral V4 segments are patent. PICA is
identified bilaterally. The basilar artery is patent.

There is a fetal origin of the right PCA. A small left posterior
communicating artery is also identified. There is short-segment
severe stenosis of the right P2 segment (12-121, 15-26, 16-20).
There is mild stenosis of the left P2 segment (15-25).

There is no aneurysm or AVM.

Venous sinuses: Patent.

Anatomic variants: As above.

Review of the MIP images confirms the above findings
IMPRESSION: 1. Remote infarct in the remote MCA distribution on a background of
parenchymal volume loss and chronic white matter microangiopathy as
above. No acute intracranial pathology.
2. Two foci of Short-segment critical stenosis/occlusion of inferior
right M2 branches with distal reconstitution, corresponding to the
infarct territory.
3. Focal severe stenosis of the right P2 segment.
4. Mild calcified plaque in the intracranial ICAs without
hemodynamically significant stenosis.
5. Mild soft plaque in the proximal right internal carotid artery
resulting in less than 50% stenosis. No significant plaque in the
left carotid system. Patent vertebral arteries in the neck.

## 2023-09-01 ENCOUNTER — Ambulatory Visit: Payer: PPO | Admitting: Family Medicine

## 2023-09-07 ENCOUNTER — Ambulatory Visit: Payer: PPO | Admitting: Family Medicine

## 2023-09-27 DEATH — deceased

## 2024-01-25 ENCOUNTER — Encounter: Payer: PPO | Admitting: Family

## 2024-01-31 NOTE — Progress Notes (Signed)
  This encounter was created in error - please disregard. Error...Marland KitchenMarland Kitchen
